# Patient Record
Sex: Female | Born: 1985 | Race: Black or African American | Hispanic: No | Marital: Single | Smoking: Never smoker
Health system: Southern US, Community
[De-identification: ages and names within clinical notes are randomized; demographics above are authoritative.]

## PROBLEM LIST (undated history)

## (undated) DIAGNOSIS — Q899 Congenital malformation, unspecified: Secondary | ICD-10-CM

## (undated) DIAGNOSIS — J45909 Unspecified asthma, uncomplicated: Secondary | ICD-10-CM

## (undated) DIAGNOSIS — M5430 Sciatica, unspecified side: Secondary | ICD-10-CM

## (undated) DIAGNOSIS — T8859XA Other complications of anesthesia, initial encounter: Secondary | ICD-10-CM

## (undated) HISTORY — DX: Other complications of anesthesia, initial encounter: T88.59XA

## (undated) HISTORY — DX: Unspecified asthma, uncomplicated: J45.909

## (undated) HISTORY — DX: Congenital malformation, unspecified: Q89.9

## (undated) HISTORY — DX: Sciatica, unspecified side: M54.30

---

## 2015-11-20 DIAGNOSIS — Q740 Other congenital malformations of upper limb(s), including shoulder girdle: Secondary | ICD-10-CM | POA: Insufficient documentation

## 2017-07-23 ENCOUNTER — Other Ambulatory Visit: Payer: Self-pay

## 2017-07-23 ENCOUNTER — Emergency Department: Payer: Medicare Other

## 2017-07-23 ENCOUNTER — Encounter: Payer: Self-pay | Admitting: *Deleted

## 2017-07-23 ENCOUNTER — Emergency Department
Admission: EM | Admit: 2017-07-23 | Discharge: 2017-07-23 | Disposition: A | Discharge status: Home / Self Care | Payer: Medicare Other | Attending: Emergency Medicine | Admitting: Emergency Medicine

## 2017-07-23 DIAGNOSIS — R10813 Right lower quadrant abdominal tenderness: Secondary | ICD-10-CM | POA: Insufficient documentation

## 2017-07-23 DIAGNOSIS — M7918 Myalgia, other site: Secondary | ICD-10-CM | POA: Diagnosis present

## 2017-07-23 DIAGNOSIS — F17210 Nicotine dependence, cigarettes, uncomplicated: Secondary | ICD-10-CM | POA: Insufficient documentation

## 2017-07-23 DIAGNOSIS — J189 Pneumonia, unspecified organism: Secondary | ICD-10-CM

## 2017-07-23 DIAGNOSIS — J111 Influenza due to unidentified influenza virus with other respiratory manifestations: Secondary | ICD-10-CM

## 2017-07-23 DIAGNOSIS — R69 Illness, unspecified: Secondary | ICD-10-CM

## 2017-07-23 DIAGNOSIS — J181 Lobar pneumonia, unspecified organism: Secondary | ICD-10-CM | POA: Diagnosis not present

## 2017-07-23 LAB — URINALYSIS, COMPLETE (UACMP) WITH MICROSCOPIC
BACTERIA UA: NONE SEEN
BILIRUBIN URINE: NEGATIVE
Glucose, UA: NEGATIVE mg/dL
Ketones, ur: 5 mg/dL — AB
Leukocytes, UA: NEGATIVE
NITRITE: NEGATIVE
PH: 6 (ref 5.0–8.0)
Protein, ur: NEGATIVE mg/dL
SPECIFIC GRAVITY, URINE: 1.001 — AB (ref 1.005–1.030)
Squamous Epithelial / LPF: NONE SEEN
WBC, UA: NONE SEEN WBC/hpf (ref 0–5)

## 2017-07-23 LAB — POCT PREGNANCY, URINE: Preg Test, Ur: NEGATIVE

## 2017-07-23 LAB — INFLUENZA PANEL BY PCR (TYPE A & B)
INFLAPCR: NEGATIVE
Influenza B By PCR: NEGATIVE

## 2017-07-23 MED ORDER — KETOROLAC TROMETHAMINE 60 MG/2ML IM SOLN
15.0000 mg | Freq: Once | INTRAMUSCULAR | Status: AC
  Administered 2017-07-23: 15 mg via INTRAMUSCULAR
  Filled 2017-07-23: qty 2

## 2017-07-23 MED ORDER — ACETAMINOPHEN 500 MG PO TABS
1000.0000 mg | ORAL_TABLET | Freq: Once | ORAL | Status: AC
  Administered 2017-07-23: 1000 mg via ORAL
  Filled 2017-07-23: qty 2

## 2017-07-23 MED ORDER — LEVOFLOXACIN 500 MG PO TABS: 500.0000 mg | ORAL_TABLET | Freq: Every day | ORAL | 0 refills | Status: AC

## 2017-07-23 NOTE — ED Provider Notes (Signed)
[facility] Emergency Department Provider Note  ____________________________________________  Time seen: Approximately 8:58 PM  I have reviewed the triage vital signs and the nursing notes.   HISTORY  Chief Complaint Generalized Body Aches and Abdominal Pain    HPI Monique Roy is a 31 y.o. female With congenital malformation of bilateral upper extremities who complains of generalized body aches for the past 2 days along with subjective fevers and chills and occasional sweats. He also has a nonproductive cough. No chest pain shortness of breath belly pain. She's had a few episodes of vomiting. No diarrhea or constipation. Also complains of low back pain. None of the pains are radiating, no aggravating or alleviating factors, moderate intensity and aching.  No history of spinal surgery or procedures. She is given birth twice but has not had an epidural.     No past medical history on file. none  There are no active problems to display for this patient.  past surgical history: C-section     Prior to Admission medications   Medication Sig Start Date End Date Taking? Authorizing Provider  levofloxacin (LEVAQUIN) 500 MG tablet Take 1 tablet (500 mg total) by mouth daily for 10 days. 07/23/17 08/02/17  Sharman CheekStafford, Cainan Trull, MD  none   Allergies Patient has no allergy information on record.   No family history on file.  Social History Social History   Tobacco Use  . Smoking status: Current Some Day Smoker    Types: Cigarettes  . Smokeless tobacco: Never Used  Substance Use Topics  . Alcohol use: No    Frequency: Never  . Drug use: Not on file    Review of Systems  Constitutional:   positive subjective fevers and chills..  ENT:   Positive sore throat. No rhinorrhea. Cardiovascular:   No chest pain or syncope. Respiratory:   No dyspnea positive cough. Gastrointestinal:   Negative for abdominal pain, positive vomiting, no diarrhea   Musculoskeletal:  positive low back pain. All other systems reviewed and are negative except as documented above in ROS and HPI.  ____________________________________________   PHYSICAL EXAM:  VITAL SIGNS: ED Triage Vitals  Enc Vitals Group     BP 07/23/17 1653 (!) 77/44     Pulse Rate 07/23/17 1649 (!) 110     Resp 07/23/17 1649 18     Temp 07/23/17 1649 100 F (37.8 C)     Temp Source 07/23/17 1720 Oral     SpO2 07/23/17 1649 96 %     Weight 07/23/17 1650 101 lb (45.8 kg)     Height 07/23/17 1650 5\' 1"  (1.549 m)     Head Circumference --      Peak Flow --      Pain Score 07/23/17 1649 10     Pain Loc --      Pain Edu? --      Excl. in GC? --     Vital signs reviewed, nursing assessments reviewed.   Constitutional:   Alert and oriented. Well appearing and in no distress. Eyes:   No scleral icterus.  EOMI. No nystagmus. No conjunctival pallor. PERRL. ENT   Head:   Normocephalic and atraumatic.   Nose:   No congestion/rhinnorhea.    Mouth/Throat:   MMM, no pharyngeal erythema. No peritonsillar mass.    Neck:   No meningismus. Full ROM. Hematological/Lymphatic/Immunilogical:   No cervical lymphadenopathy. Cardiovascular:   tachycardia heart rate 102. Symmetric bilateral radial and DP pulses.  No murmurs.  Respiratory:  Normal respiratory effort without tachypnea/retractions. Breath sounds are clear and equal bilaterally. No wheezes/rales/rhonchi. Gastrointestinal:   Soft with mild right lower quadrant tenderness. Non distended. There is no CVA tenderness.  No rebound, rigidity, or guarding. Genitourinary:   deferred Musculoskeletal:   Normal range of motion in all extremities. No joint effusions.  No lower extremity tenderness.  No edema.there is midline spinal tenderness over the mid lumbar spine Neurologic:   Normal speech and language.  Motor grossly intact. No gross focal neurologic deficits are appreciated.  Skin:    Skin is warm, dry and intact. No  rash noted.  No petechiae, purpura, or bullae.  ____________________________________________    LABS (pertinent positives/negatives) (all labs ordered are listed, but only abnormal results are displayed) Labs Reviewed  URINALYSIS, COMPLETE (UACMP) WITH MICROSCOPIC - Abnormal; Notable for the following components:      Result Value   Color, Urine STRAW (*)    APPearance CLEAR (*)    Specific Gravity, Urine 1.001 (*)    Hgb urine dipstick SMALL (*)    Ketones, ur 5 (*)    All other components within normal limits  CULTURE, BLOOD (ROUTINE X 2)  CULTURE, BLOOD (ROUTINE X 2)  INFLUENZA PANEL BY PCR (TYPE A & B)  COMPREHENSIVE METABOLIC PANEL  LACTIC ACID, PLASMA  LACTIC ACID, PLASMA  CBC WITH DIFFERENTIAL/PLATELET  PROTIME-INR  POC URINE PREG, ED  POCT PREGNANCY, URINE   ____________________________________________   EKG  interpreted by me Sinus rhythm rate of 99, normal axis , slightly prolonged QTC of 510. Normal QRS ST segments and T waves.  ____________________________________________    RADIOLOGY  Ct Abdomen Pelvis Wo Contrast  Result Date: 07/23/2017 CLINICAL DATA:  Generalized body, lower abdominal pain EXAM: CT ABDOMEN AND PELVIS WITHOUT CONTRAST TECHNIQUE: Multidetector CT imaging of the abdomen and pelvis was performed following the standard protocol without IV contrast. COMPARISON:  None. FINDINGS: Lower chest: Airspace disease in the left lower lobe compatible with pneumonia. No effusions. Hepatobiliary: No focal hepatic abnormality. Gallbladder unremarkable. Pancreas: No focal abnormality or ductal dilatation. Spleen: No focal abnormality.  Normal size. Adrenals/Urinary Tract: No adrenal abnormality. No focal renal abnormality. No stones or hydronephrosis. Urinary bladder is unremarkable. Stomach/Bowel: Stomach, large and small bowel grossly unremarkable. Vascular/Lymphatic: Uterus and adnexa unremarkable.  No mass. Reproductive: Uterus and adnexa unremarkable.  No  mass. Other: No free fluid or free air. Musculoskeletal: No acute bony abnormality. IMPRESSION: Airspace consolidation in the left lower lobe compatible with pneumonia. No acute findings in the abdomen or pelvis on this unenhanced study. Electronically Signed   By: Charlett Nose M.D.   On: 07/23/2017 18:57   Dg Chest Portable 1 View  Result Date: 07/23/2017 CLINICAL DATA:  Productive cough and fever. EXAM: PORTABLE CHEST 1 VIEW COMPARISON:  None. FINDINGS: The cardiomediastinal silhouette is normal in size. Normal pulmonary vascularity. No focal consolidation, pleural effusion, or pneumothorax. Nonunion of the mid right clavicle, which may be related to prior trauma or congenital in etiology. IMPRESSION: No active disease. Electronically Signed   By: Obie Dredge M.D.   On: 07/23/2017 17:12    ____________________________________________   PROCEDURES Procedures  ____________________________________________   DIFFERENTIAL DIAGNOSIS  appendicitis, diverticulitis, cystitis, pneumonia, epidural abscess, discitis or osteomyelitis of the spine  CLINICAL IMPRESSION / ASSESSMENT AND PLAN / ED COURSE  Pertinent labs & imaging results that were available during my care of the patient were reviewed by me and considered in my medical decision making (see chart for details).  Clinical Course as of Jul 23 2105  Wed Jul 23, 2017  1751 Pt refuses iv sticks for IV access and blood draw. Refuses central line (which has been required in past for her for iv access). Ua and cxr neg. Will proceed with imaging to search for infectious cause. If positive, can reassess with pt the need for iv access. Plan CT a/p without con and MRI L spine if CT negative.  On my exam, WA, NAD, 102/73, 95% RA, hr 102.   Offered fem stick for labs, refused.   Pt not clearly septic or in shock. Will work up within the limitations of what she desires for her medical care and wishes to avoid invasive procedure.  [PS]   2055 Pt now reports she is not willing to stay for MRI, wants to be DC'd immediatley, feels better.  BP stable. Tolerating PO. Has MDM capacity. Will DC. Will tx for pna.   [PS]    Clinical Course User Index [PS] Sharman CheekStafford, Ardit Danh, MD     ____________________________________________   FINAL CLINICAL IMPRESSION(S) / ED DIAGNOSES    Final diagnoses:  Influenza-like illness  Community acquired pneumonia of left lower lobe of lung (HCC)      This SmartLink is deprecated. Use AVSMEDLIST instead to display the medication list for a patient.   Portions of this note were generated with dragon dictation software. Dictation errors may occur despite best attempts at proofreading.    Sharman CheekStafford, Kohana Amble, MD 07/23/17 2106

## 2017-07-23 NOTE — ED Notes (Signed)
Pt refuses central line and wants to try less invasive at this time, MD states he will try US IV

## 2017-07-23 NOTE — ED Notes (Signed)
MD will try us IV for access

## 2017-07-23 NOTE — ED Notes (Signed)
Pt unable to stick, states she only gets central lines. MD aware

## 2017-07-23 NOTE — ED Triage Notes (Signed)
Pt to ED reporting generalized body aches, back pain, lower abd pain,  productive cough with yellow sputum, and weakness.  Symptoms are reported to have been for the last week. Unknown if pt has had fevers at home. No diarrhea or vomiting reported.

## 2017-07-24 ENCOUNTER — Telehealth: Payer: Self-pay | Admitting: Emergency Medicine

## 2017-07-24 NOTE — Telephone Encounter (Signed)
[facility] called to clarify levaquin quantity.  Per dr Derrill Kaygoodman patient should take for 7 days.

## 2019-05-10 ENCOUNTER — Ambulatory Visit: Payer: Medicare Other

## 2019-05-10 ENCOUNTER — Other Ambulatory Visit: Payer: Self-pay

## 2019-05-10 ENCOUNTER — Ambulatory Visit (LOCAL_COMMUNITY_HEALTH_CENTER): Payer: Medicare Other | Admitting: Advanced Practice Midwife

## 2019-05-10 VITALS — BP 111/77 | Ht 60.0 in | Wt 153.8 lb

## 2019-05-10 DIAGNOSIS — Q899 Congenital malformation, unspecified: Secondary | ICD-10-CM | POA: Insufficient documentation

## 2019-05-10 DIAGNOSIS — Z3009 Encounter for other general counseling and advice on contraception: Secondary | ICD-10-CM

## 2019-05-10 DIAGNOSIS — Z30013 Encounter for initial prescription of injectable contraceptive: Secondary | ICD-10-CM | POA: Diagnosis not present

## 2019-05-10 LAB — PREGNANCY, URINE: Preg Test, Ur: NEGATIVE

## 2019-05-10 MED ORDER — MEDROXYPROGESTERONE ACETATE 150 MG/ML IM SUSP
150.0000 mg | Freq: Once | INTRAMUSCULAR | Status: AC
Start: 2019-05-10 — End: 2019-05-10
  Administered 2019-05-10: 17:00:00 150 mg via INTRAMUSCULAR

## 2019-05-10 NOTE — Progress Notes (Signed)
   [place] problem visit  [place] Department  Subjective:  Monique Roy is a 33 y.o. being seen today for DMPA.  Last DMPA given "3 months ago in [facility]".  Pt states LMP 03/2019 and SVD 12/04/2018 bottlefeeding.  Nonsmoker.  Last sex 2019. LMP 03/2018?  States moved here from [facility] 03/2019  Chief Complaint  Patient presents with  . Contraception    needs Depo    HPI 33 yo SBF here for DMPA.  Vague historian and states her OB/GYN in [facility] told her she needed DMPA today.  Pt states she just urinated And can't give any more for PT.  Her Ob/Gyn office is closed so unable to verify today.  Here with 3 children  Does the patient have a current or past history of drug use? No   No components found for: HCV]   Health Maintenance Due  Topic Date Due  . HIV Screening  07/13/2001  . PAP SMEAR-Modifier  07/14/2007  . INFLUENZA VACCINE  03/06/2019    ROS  The following portions of the patient's history were reviewed and updated as appropriate: allergies, current medications, past family history, past medical history, past social history, past surgical history and problem list. Problem list updated.   See flowsheet for other program required questions.  Objective:   Vitals:   05/10/19 1541  BP: 111/77  Weight: 153 lb 12.8 oz (69.8 kg)  Height: 5' (1.524 m)    Physical Exam  n/a  Assessment and Plan:  Monique Roy is a 33 y.o. female presenting to the [facility] Department for a Women's Health problem visit    1.  Family planning If PT neg today may have DMPA 150 mg IM x1  Please counsel on need for abstinance/backup condoms next 7 days Needs to return for physical/CBE/pap before next DMPA given ROI last DMPA and pap [place], [place]     Return for 11-13 wk DMPA.  No future appointments.  Herbie Saxon, CNM

## 2019-05-10 NOTE — Progress Notes (Signed)
Pt here for Depo, was getting Depo at [facility] in [place] but recently moved. Last Depo injection was 3 months ago per pt.Ronny Bacon, RN

## 2019-05-10 NOTE — Progress Notes (Signed)
PT negative. Patient signed ROI for last depo visit in [facility]. Patient given Depo per provider orders and given next Depo reminder card.Jenetta Downer, RN

## 2019-05-18 ENCOUNTER — Other Ambulatory Visit: Payer: Self-pay

## 2019-05-18 DIAGNOSIS — Z20822 Contact with and (suspected) exposure to covid-19: Secondary | ICD-10-CM

## 2019-05-20 LAB — NOVEL CORONAVIRUS, NAA: SARS-CoV-2, NAA: NOT DETECTED

## 2019-05-23 ENCOUNTER — Emergency Department: Payer: Medicare Other

## 2019-05-23 ENCOUNTER — Other Ambulatory Visit: Payer: Self-pay

## 2019-05-23 ENCOUNTER — Encounter: Payer: Self-pay | Admitting: Emergency Medicine

## 2019-05-23 ENCOUNTER — Emergency Department
Admission: EM | Admit: 2019-05-23 | Discharge: 2019-05-23 | Disposition: A | Discharge status: Home / Self Care | Payer: Medicare Other | Attending: Emergency Medicine | Admitting: Emergency Medicine

## 2019-05-23 DIAGNOSIS — R0602 Shortness of breath: Secondary | ICD-10-CM | POA: Insufficient documentation

## 2019-05-23 DIAGNOSIS — F1210 Cannabis abuse, uncomplicated: Secondary | ICD-10-CM | POA: Insufficient documentation

## 2019-05-23 DIAGNOSIS — R05 Cough: Secondary | ICD-10-CM | POA: Diagnosis present

## 2019-05-23 DIAGNOSIS — J40 Bronchitis, not specified as acute or chronic: Secondary | ICD-10-CM | POA: Diagnosis not present

## 2019-05-23 MED ORDER — ALBUTEROL SULFATE HFA 108 (90 BASE) MCG/ACT IN AERS
2.0000 | INHALATION_SPRAY | Freq: Once | RESPIRATORY_TRACT | Status: AC
  Administered 2019-05-23: 2 via RESPIRATORY_TRACT
  Filled 2019-05-23: qty 6.7

## 2019-05-23 MED ORDER — PREDNISONE 20 MG PO TABS
60.0000 mg | ORAL_TABLET | Freq: Once | ORAL | Status: AC
  Administered 2019-05-23: 60 mg via ORAL
  Filled 2019-05-23: qty 3

## 2019-05-23 MED ORDER — HYDROCOD POLST-CPM POLST ER 10-8 MG/5ML PO SUER: 5.0000 mL | Freq: Two times a day (BID) | ORAL | 0 refills | Status: DC

## 2019-05-23 MED ORDER — PREDNISONE 20 MG PO TABS: ORAL_TABLET | ORAL | 0 refills | Status: DC

## 2019-05-23 NOTE — ED Notes (Signed)
Pt refused COVID-19 swab after swab unwrapped and placed in nare.

## 2019-05-23 NOTE — Discharge Instructions (Addendum)
1.  Finish prednisone 60 mg daily x4 days.  Start your next dose tomorrow morning. 2.  You may use albuterol inhaler 2 puffs every 4 hours as needed for cough/wheezing/difficulty breathing. 3.  You may take Tussionex as needed for cough. 4.  Return to the ER for worsening symptoms, persistent vomiting, difficulty breathing or other concerns.

## 2019-05-23 NOTE — ED Triage Notes (Addendum)
Pt reports shortness of breath for one week; worsens at night when she lays down; pt took OTC Robitussin DM and theraflu with no relief; also using vaporub with no relief; reports productive cough of white and yellow; negative COVID on 05/18/19; pt talking in complete coherent sentences; audible wheezes in triage;

## 2019-05-23 NOTE — ED Provider Notes (Signed)
[facility] Emergency Department Provider Note   ____________________________________________   First MD Initiated Contact with Patient 05/23/19 0410     (approximate)  I have reviewed the triage vital signs and the nursing notes.   HISTORY  Chief Complaint Shortness of Breath    HPI Monique Roy is a 33 y.o. female who presents to the ED from home with a chief complaint of cough and shortness of breath.  Symptoms x1 week.  Has been taking over-the-counter cough medicine and TheraFlu without relief of symptoms.  Negative Covid test on 10/13.  No other household members are sick.  Denies fever, chest pain, abdominal pain, nausea, vomiting or diarrhea.       History reviewed. No pertinent past medical history.  Patient Active Problem List   Diagnosis Date Noted  . 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 CESAREAN SECTION      Prior to Admission medications   Medication Sig Start Date End Date Taking? Authorizing Provider  chlorpheniramine-HYDROcodone (TUSSIONEX PENNKINETIC ER) 10-8 MG/5ML SUER Take 5 mLs by mouth 2 (two) times daily. 05/23/19   Irean Hong, MD  predniSONE (DELTASONE) 20 MG tablet 3 tablets daily x 4 days 05/23/19   Irean Hong, MD    Allergies Patient has no known allergies.  History reviewed. No pertinent family history.  Social History Social History   Tobacco Use  . Smoking status: Never Smoker  . Smokeless tobacco: Never Used  Substance Use Topics  . Alcohol use: No    Frequency: Never  . Drug use: Yes    Types: Marijuana    Comment: last smoked end of September    Review of Systems  Constitutional: No fever/chills Eyes: No visual changes. ENT: No sore throat. Cardiovascular: Denies chest pain. Respiratory: Positive for cough and wheezing.  Denies shortness of breath. Gastrointestinal: No abdominal pain.  No nausea, no vomiting.  No diarrhea.  No  constipation. Genitourinary: Negative for dysuria. Musculoskeletal: Negative for back pain. Skin: Negative for rash. Neurological: Negative for headaches, focal weakness or numbness.   ____________________________________________   PHYSICAL EXAM:  VITAL SIGNS: ED Triage Vitals  Enc Vitals Group     BP 05/23/19 0359 (!) 152/104     Pulse Rate 05/23/19 0359 80     Resp 05/23/19 0359 (!) 23     Temp 05/23/19 0359 97.9 F (36.6 C)     Temp Source 05/23/19 0359 Oral     SpO2 05/23/19 0359 100 %     Weight 05/23/19 0355 153 lb (69.4 kg)     Height 05/23/19 0355 5' (1.524 m)     Head Circumference --      Peak Flow --      Pain Score 05/23/19 0355 0     Pain Loc --      Pain Edu? --      Excl. in GC? --     Constitutional: Alert and oriented. Well appearing and in no acute distress. Eyes: Conjunctivae are normal. PERRL. EOMI. Head: Atraumatic. Nose: No congestion/rhinnorhea. Mouth/Throat: Mucous membranes are moist.  Oropharynx non-erythematous. Neck: No stridor.   Cardiovascular: Normal rate, regular rhythm. Grossly normal heart sounds.  Good peripheral circulation. Respiratory: Normal respiratory effort.  No retractions. Lungs with slight wheezing. Gastrointestinal: Soft and nontender. No distention. No abdominal bruits. No CVA tenderness. Musculoskeletal: Congenital missing upper limbs.  No lower extremity tenderness nor edema.  No joint effusions. Neurologic:  Normal speech and language. No gross focal neurologic deficits are appreciated. No gait instability. Skin:  Skin is warm, dry and intact. No rash noted. Psychiatric: Mood and affect are normal. Speech and behavior are normal.  ____________________________________________   LABS (all labs ordered are listed, but only abnormal results are displayed)  Labs Reviewed  SARS CORONAVIRUS 2 (TAT 6-24 HRS)   ____________________________________________  EKG  None ____________________________________________   RADIOLOGY  ED MD interpretation: No pneumonia  Official radiology report(s): Dg Chest 1 View  Result Date: 05/23/2019 CLINICAL DATA:  Shortness of breath for the past week. EXAM: CHEST  1 VIEW COMPARISON:  07/23/2017 FINDINGS: Grossly unchanged cardiac silhouette and mediastinal contours. No focal airspace opacities. No pleural effusion or pneumothorax. No evidence of edema. No acute osseous abnormalities. IMPRESSION: No acute cardiopulmonary disease. Electronically Signed   By: Sandi Mariscal M.D.   On: 05/23/2019 05:23    ____________________________________________   PROCEDURES  Procedure(s) performed (including Critical Care):  Procedures   ____________________________________________   INITIAL IMPRESSION / ASSESSMENT AND PLAN / ED COURSE  As part of my medical decision making, I reviewed the following data within the [place] notes reviewed and incorporated, Radiograph reviewed and Notes from prior ED visits     Monique Roy was evaluated in Emergency Department on 05/23/2019 for the symptoms described in the history of present illness. She was evaluated in the context of the global COVID-19 pandemic, which necessitated consideration that the patient might be at risk for infection with the SARS-CoV-2 virus that causes COVID-19. Institutional protocols and algorithms that pertain to the evaluation of patients at risk for COVID-19 are in a state of rapid change based on information released by regulatory bodies including the CDC and federal and state organizations. These policies and algorithms were followed during the patient's care in the ED.    34 year old female who presents with a 1 week history of cough and shortness of breath. Differential includes, but is not limited to, viral syndrome, bronchitis including COPD exacerbation, pneumonia, reactive airway disease including asthma, CHF including exacerbation with or without pulmonary/interstitial edema,  pneumothorax, ACS, thoracic trauma, and pulmonary embolism.  Room air saturations 100%.  Will obtain chest x-ray, send out Covid swab.  Start Prednisone, Tussionex and albuterol inhaler.  Will add azithromycin if chest x-ray is suspicious for pneumonia.  Clinical Course as of May 22 534  Sun May 23, 2019  0532 Chest x-ray unremarkable.  Patient refused COVID-19 swab.  Will discharge home on prednisone burst, albuterol inhaler and Tussionex.  Return precautions given.  Patient verbalizes understanding and agrees with plan of care.   [JS]    Clinical Course User Index [JS] Paulette Blanch, MD     ____________________________________________   FINAL CLINICAL IMPRESSION(S) / ED DIAGNOSES  Final diagnoses:  Bronchitis     ED Discharge Orders         Ordered    predniSONE (DELTASONE) 20 MG tablet     05/23/19 0534    chlorpheniramine-HYDROcodone (TUSSIONEX PENNKINETIC ER) 10-8 MG/5ML SUER  2 times daily     05/23/19 0534           Note:  This document was prepared using Dragon voice recognition software and may include unintentional dictation errors.   Paulette Blanch, MD 05/23/19 [phone]

## 2019-06-09 ENCOUNTER — Ambulatory Visit
Admission: EM | Admit: 2019-06-09 | Discharge: 2019-06-09 | Disposition: A | Discharge status: Home / Self Care | Payer: Medicare Other | Attending: Family Medicine | Admitting: Family Medicine

## 2019-06-09 ENCOUNTER — Encounter: Payer: Self-pay | Admitting: Emergency Medicine

## 2019-06-09 ENCOUNTER — Other Ambulatory Visit: Payer: Self-pay

## 2019-06-09 DIAGNOSIS — J9801 Acute bronchospasm: Secondary | ICD-10-CM

## 2019-06-09 DIAGNOSIS — Z79899 Other long term (current) drug therapy: Secondary | ICD-10-CM | POA: Diagnosis not present

## 2019-06-09 DIAGNOSIS — Z7952 Long term (current) use of systemic steroids: Secondary | ICD-10-CM | POA: Diagnosis not present

## 2019-06-09 DIAGNOSIS — Z793 Long term (current) use of hormonal contraceptives: Secondary | ICD-10-CM | POA: Insufficient documentation

## 2019-06-09 DIAGNOSIS — Z20828 Contact with and (suspected) exposure to other viral communicable diseases: Secondary | ICD-10-CM | POA: Insufficient documentation

## 2019-06-09 DIAGNOSIS — R062 Wheezing: Secondary | ICD-10-CM | POA: Diagnosis present

## 2019-06-09 MED ORDER — PREDNISONE 20 MG PO TABS: ORAL_TABLET | ORAL | 0 refills | Status: DC

## 2019-06-09 MED ORDER — ALBUTEROL SULFATE HFA 108 (90 BASE) MCG/ACT IN AERS: 1.0000 | INHALATION_SPRAY | Freq: Four times a day (QID) | RESPIRATORY_TRACT | 0 refills | Status: DC | PRN

## 2019-06-09 NOTE — ED Triage Notes (Signed)
Patient c/o wheezing and coughing that started on Sunday. She states she was seen in the ER for bronchitis on 05/23/19. She was given Prednisone which she stated helped her but she was unable to pick up the Tussionex. She stated when she leaves here she is going to pick that up but is requesting another prescription for Prednisone. She is also using an inhaler and she says she has about 50 doses left.

## 2019-06-09 NOTE — ED Provider Notes (Addendum)
[facility]    CSN: [id number] Arrival date & time: 06/09/19  1521      History   Chief Complaint Chief Complaint  Patient presents with  . Wheezing  . Cough    HPI Monique Roy is a 33 y.o. female.   33 yo female with a c/o recurrence of her wheezing and mild cough for the past 3 days. Denies any fevers, chills, shortness of breath, chest pain. States was seen in ED on 05/23/19, chest x-ray negative but refused covid test.  States she improved with albuterol and 4 days of prednisone.    Wheezing Associated symptoms: cough   Cough Associated symptoms: wheezing     History reviewed. No pertinent past medical history.  Patient Active Problem List   Diagnosis Date Noted  . 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 CESAREAN SECTION      OB History   No obstetric history on file.      Home Medications    Prior to Admission medications   Medication Sig Start Date End Date Taking? Authorizing Provider  chlorpheniramine-HYDROcodone (TUSSIONEX PENNKINETIC ER) 10-8 MG/5ML SUER Take 5 mLs by mouth 2 (two) times daily. 05/23/19  Yes Paulette Blanch, MD  medroxyPROGESTERone (DEPO-PROVERA) 150 MG/ML injection Inject 150 mg into the muscle every 3 (three) months.   Yes [provider]  albuterol (VENTOLIN HFA) 108 (90 Base) MCG/ACT inhaler Inhale 1-2 puffs into the lungs every 6 (six) hours as needed for wheezing or shortness of breath. 06/09/19   Norval Gable, MD  predniSONE (DELTASONE) 20 MG tablet 3 tabs po qd x 2 days, then 2 tabs po qd x 3 days, then 1 tab po qd x 3 days, then half a tab po qd x 2 days 06/09/19   Norval Gable, MD    Family History History reviewed. No pertinent family history.  Social History Social History   Tobacco Use  . Smoking status: Never Smoker  . Smokeless tobacco: Never Used  Substance Use Topics  . Alcohol use: Yes    Frequency: Never  . Drug use: Yes   Types: Marijuana    Comment: last use 1 week ago     Allergies   Patient has no known allergies.   Review of Systems Review of Systems  Respiratory: Positive for cough and wheezing.      Physical Exam Triage Vital Signs ED Triage Vitals  Enc Vitals Group     BP 06/09/19 1544 104/81     Pulse Rate 06/09/19 1544 (!) 120     Resp 06/09/19 1544 (!) 24     Temp 06/09/19 1544 98.6 F (37 C)     Temp Source 06/09/19 1544 Oral     SpO2 06/09/19 1544 95 %     Weight 06/09/19 1540 154 lb (69.9 kg)     Height 06/09/19 1540 5' (1.524 m)     Head Circumference --      Peak Flow --      Pain Score 06/09/19 1540 0     Pain Loc --      Pain Edu? --      Excl. in [place]? --    No data found.  Updated Vital Signs BP 104/81 (BP Location: Right Arm)   Pulse (!) 120   Temp 98.6 F (37 C) (Oral)   Resp (!) 24   Ht 5' (1.524 m)   Wt 69.9 kg  SpO2 95%   BMI 30.08 kg/m   Visual Acuity Right Eye Distance:   Left Eye Distance:   Bilateral Distance:    Right Eye Near:   Left Eye Near:    Bilateral Near:     Physical Exam Vitals signs and nursing note reviewed.  Constitutional:      General: She is not in acute distress.    Appearance: She is not toxic-appearing or diaphoretic.  Cardiovascular:     Rate and Rhythm: Regular rhythm. Tachycardia present.     Pulses: Normal pulses.  Pulmonary:     Effort: Pulmonary effort is normal. No respiratory distress.     Breath sounds: No stridor. Wheezing (mild, diffuse, bilaterally) present. No rhonchi or rales.  Neurological:     General: No focal deficit present.     Mental Status: She is alert.      UC Treatments / Results  Labs (all labs ordered are listed, but only abnormal results are displayed) Labs Reviewed  NOVEL CORONAVIRUS, NAA (HOSP ORDER, SEND-OUT TO REF LAB; TAT 18-24 HRS)    EKG   Radiology No results found.  Procedures Procedures (including critical care time)  Medications Ordered in UC Medications -  No data to display  Initial Impression / Assessment and Plan / UC Course  I have reviewed the triage vital signs and the nursing notes.  Pertinent labs & imaging results that were available during my care of the patient were reviewed by me and considered in my medical decision making (see chart for details).      Final Clinical Impressions(s) / UC Diagnoses   Final diagnoses:  Wheezing  Bronchospasm     Discharge Instructions     Go to Emergency Department if symptoms worsen    ED Prescriptions    Medication Sig Dispense Auth. Provider   predniSONE (DELTASONE) 20 MG tablet 3 tabs po qd x 2 days, then 2 tabs po qd x 3 days, then 1 tab po qd x 3 days, then half a tab po qd x 2 days 16 tablet Kerstie Agent, MD   albuterol (VENTOLIN HFA) 108 (90 Base) MCG/ACT inhaler Inhale 1-2 puffs into the lungs every 6 (six) hours as needed for wheezing or shortness of breath. 8 g Payton Mccallum, MD      1.diagnosis reviewed with patient 2. rx as per orders above; reviewed possible side effects, interactions, risks and benefits  3. Recommend supportive treatment rest, fluids 4.  covid test done 5. Go to Emergency Department if symptoms worsen 6. Follow-up prn   PDMP not reviewed this encounter.   Payton Mccallum, MD 06/09/19 1728    Payton Mccallum, MD 06/09/19 1729    Payton Mccallum, MD 06/09/19 1730

## 2019-06-09 NOTE — Discharge Instructions (Signed)
Go to Emergency Department if symptoms worsen °

## 2019-06-10 LAB — NOVEL CORONAVIRUS, NAA (HOSP ORDER, SEND-OUT TO REF LAB; TAT 18-24 HRS): SARS-CoV-2, NAA: NOT DETECTED

## 2019-06-14 ENCOUNTER — Encounter: Payer: Self-pay | Admitting: Physician Assistant

## 2019-06-14 NOTE — Progress Notes (Signed)
Reviewed record from [facility] in [place], [place].  Per record, patient had PP exam on 01/14/2019.  Per note, pap is due fall of 2020.  Depo after delivery in hospital or at this PP visit, though not clear from note.

## 2019-06-21 ENCOUNTER — Other Ambulatory Visit: Payer: Self-pay

## 2019-06-21 ENCOUNTER — Ambulatory Visit
Admission: EM | Admit: 2019-06-21 | Discharge: 2019-06-21 | Disposition: A | Discharge status: Home / Self Care | Payer: Medicare Other | Attending: Family Medicine | Admitting: Family Medicine

## 2019-06-21 DIAGNOSIS — R05 Cough: Secondary | ICD-10-CM

## 2019-06-21 DIAGNOSIS — R058 Other specified cough: Secondary | ICD-10-CM

## 2019-06-21 DIAGNOSIS — R062 Wheezing: Secondary | ICD-10-CM

## 2019-06-21 MED ORDER — FLUTICASONE-SALMETEROL 100-50 MCG/DOSE IN AEPB: 1.0000 | INHALATION_SPRAY | Freq: Two times a day (BID) | RESPIRATORY_TRACT | 1 refills | Status: DC

## 2019-06-21 MED ORDER — AZITHROMYCIN 250 MG PO TABS: ORAL_TABLET | ORAL | 0 refills | Status: DC

## 2019-06-21 MED ORDER — PREDNISONE 10 MG PO TABS: ORAL_TABLET | ORAL | 0 refills | Status: DC

## 2019-06-21 NOTE — ED Triage Notes (Addendum)
Pt reports seen twice for same over past month with 2 COVID tests both negative. Pt reports while she is taking the prescribed Prednisone she feels fine but as soon as the medicine runs out, the sx return of cough wheezing and SOB. No PCP as yet as she is new to the area. No fever

## 2019-06-21 NOTE — ED Provider Notes (Addendum)
[facility] ____________________________________________  Time seen: Approximately 6:47 PM  I have reviewed the triage vital signs and the nursing notes.   HISTORY  Chief Complaint Cough and Shortness of Breath   HPI Monique Roy is a 33 y.o. female presenting for evaluation of wheezing.  Patient reports for the last 2 months she has had recurrent cough and wheezing.  States the cough only occurs when actively wheezing and she can hear.  States she has been seen twice for the same complaints and has been given prednisone.  Reports symptoms fully resolve while taking the prednisone but once stopping prednisone symptoms return.  Occasional nasal congestion.  Denies sore throat, fevers, hemoptysis, extremity swelling, immobilization has had 2 negative Covid tests during the same time.  With the last being 11/4.  Chest x-ray 05/23/2019 was negative.  Reports she did have recurrent bronchitis as a child but has never been labeled as asthmatic.  Reports children healthy and not recent sickness.  Has continued remain active.  States she does continue to use her albuterol inhaler intermittently which helps but symptoms shortly returned.  Reports symptoms of wheezing returned 3 days ago after completing the prednisone course.  Denies any accompanying chest pain or chest pain with deep breath.  States shortness of breath is more of a tightness and wheeze sensation.  Intermittent cough, occasional cough productive of mucus.  Denies other aggravating alleviating factors.  Has not been on antibiotic recently.  Denies pregnancy.  No changes in taste or smell.  States that she does not currently have a primary care.   History reviewed. No pertinent past medical history.  Patient Active Problem List   Diagnosis Date Noted  . 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 CESAREAN SECTION       No current facility-administered medications  for this encounter.   Current Outpatient Medications:  .  albuterol (VENTOLIN HFA) 108 (90 Base) MCG/ACT inhaler, Inhale 1-2 puffs into the lungs every 6 (six) hours as needed for wheezing or shortness of breath., Disp: 8 g, Rfl: 0 .  azithromycin (ZITHROMAX Z-PAK) 250 MG tablet, Take 2 tablets (500 mg) on  Day 1,  followed by 1 tablet (250 mg) once daily on Days 2 through 5., Disp: 6 each, Rfl: 0 .  Fluticasone-Salmeterol (ADVAIR DISKUS) 100-50 MCG/DOSE AEPB, Inhale 1 puff into the lungs 2 (two) times daily., Disp: 60 each, Rfl: 1 .  medroxyPROGESTERone (DEPO-PROVERA) 150 MG/ML injection, Inject 150 mg into the muscle every 3 (three) months., Disp: , Rfl:  .  predniSONE (DELTASONE) 10 MG tablet, Start 60 mg po day one, then 50 mg po day two, taper by 10 mg daily until complete., Disp: 21 tablet, Rfl: 0  Allergies Patient has no known allergies.  pertinent family history Denies others in household with similar.  Social History Social History   Tobacco Use  . Smoking status: Never Smoker  . Smokeless tobacco: Never Used  Substance Use Topics  . Alcohol use: Yes    Frequency: Never    Comment: social  . Drug use: Yes    Types: Marijuana    Comment: last use 1 week ago    Review of Systems Constitutional: No fever/chills ENT: No sore throat. Cardiovascular: Denies chest pain. Respiratory: As above. Gastrointestinal: No abdominal pain.  No nausea, no vomiting.  No diarrhea.   Genitourinary: Negative for dysuria. Musculoskeletal: Negative for back pain. Skin: Negative for rash.   ____________________________________________  PHYSICAL EXAM:  VITAL SIGNS: ED Triage Vitals  Enc Vitals Group     BP 06/21/19 1751 114/87     Pulse Rate 06/21/19 1751 (!) 109 Recheck 82     Resp 06/21/19 1751 19     Temp 06/21/19 1751 98.1 F (36.7 C)     Temp Source 06/21/19 1751 Oral     SpO2 06/21/19 1751 100 %     Weight 06/21/19 1754 154 lb (69.9 kg)     Height 06/21/19 1754 5'  (1.524 m)     Head Circumference --      Peak Flow --      Pain Score 06/21/19 1754 0     Pain Loc --      Pain Edu? --      Excl. in [place]? --     Constitutional: Alert and oriented. Well appearing and in no acute distress. Eyes: Conjunctivae are normal.  ENT      Head: Normocephalic and atraumatic. Cardiovascular: Normal rate, regular rhythm. Grossly normal heart sounds.  Good peripheral circulation. Respiratory: Normal respiratory effort without tachypnea nor retractions.  No rhonchi.  Speaks in complete sentences.  Mild scattered inspiratory and expiratory wheezes.  No cough noted in room. Musculoskeletal: Steady gait.  No lower extremity edema noted bilaterally. Neurologic:  Normal speech and language. Speech is normal. No gait instability.  Skin:  Skin is warm, dry and intact. No rash noted. Psychiatric: Mood and affect are normal. Speech and behavior are normal. Patient exhibits appropriate insight and judgment   ___________________________________________   LABS (all labs ordered are listed, but only abnormal results are displayed)  Labs Reviewed - No data to display   PROCEDURES Procedures   INITIAL IMPRESSION / ASSESSMENT AND PLAN / ED COURSE  Pertinent labs & imaging results that were available during my care of the patient were reviewed by me and considered in my medical decision making (see chart for details). Discussed patient and plan of care with Dr. Judd Gaudier, who agreed with plan.   Patient is well-appearing.  Denies recent fevers.  Wheezes noted on exam.  Suspect bronchitis, discussed concern for asthma.  Symptoms reported to fully resolve while taking prednisone.  Will start patient on daily Advair post current prednisone taper.  Will empirically treat with azithromycin.  Will defer x-ray at this time as patient recently had chest x-ray, and patient declines x-ray.  Continue albuterol inhaler as needed.  Discussed very strict follow-up and return parameters.  Strongly  encourage patient to establish local primary care for close follow-up, also follow-up with pulmonology as needed.Discussed indication, risks and benefits of medications with patient.    Discussed follow up and return parameters including no resolution or any worsening concerns. Patient verbalized understanding and agreed to plan.   ____________________________________________   FINAL CLINICAL IMPRESSION(S) / ED DIAGNOSES  Final diagnoses:  Wheezing  Cough present for greater than 3 weeks     ED Discharge Orders         Ordered    Fluticasone-Salmeterol (ADVAIR DISKUS) 100-50 MCG/DOSE AEPB  2 times daily     06/21/19 1851    predniSONE (DELTASONE) 10 MG tablet     06/21/19 1851    azithromycin (ZITHROMAX Z-PAK) 250 MG tablet     06/21/19 1851           Note: This dictation was prepared with Dragon dictation along with smaller phrase technology. Any transcriptional errors that result from this process are unintentional.  Marylene Land, NP 06/21/19 1941

## 2019-06-21 NOTE — Discharge Instructions (Signed)
Take medication as prescribed. Rest. Drink plenty of fluids.  Continue albuterol inhaler as needed.  Establish and follow-up with primary care as soon as possible.  Follow-up with pulmonology as needed.  Return to Urgent care as needed.  Proceed directly to the emergency room for increasing shortness of breath, chest pain or worsening concerns.

## 2019-07-26 ENCOUNTER — Ambulatory Visit (LOCAL_COMMUNITY_HEALTH_CENTER): Payer: Medicare Other

## 2019-07-26 ENCOUNTER — Other Ambulatory Visit: Payer: Self-pay

## 2019-07-26 VITALS — BP 120/80 | Ht 60.0 in | Wt 158.0 lb

## 2019-07-26 DIAGNOSIS — Z30013 Encounter for initial prescription of injectable contraceptive: Secondary | ICD-10-CM

## 2019-07-26 DIAGNOSIS — Z3009 Encounter for other general counseling and advice on contraception: Secondary | ICD-10-CM

## 2019-07-26 MED ORDER — MEDROXYPROGESTERONE ACETATE 150 MG/ML IM SUSP
150.0000 mg | Freq: Once | INTRAMUSCULAR | Status: AC
  Administered 2019-07-26: 150 mg via INTRAMUSCULAR

## 2019-07-26 NOTE — Progress Notes (Signed)
Presents for Depo, but no current Depo orders. 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 Consult with Antoine Primas PA with following verbal orders given: 1) Depo 150 mg x1 today and 2 ) PAP due and needs to schedule physical appt when next Depo due 10/11/2019. Client counseled regarding above. 01/2019 post-partum visit records have been scanned. Client requested Depo in left arm today as hips are sore. Administered without difficulty per above order.  Rich Number, RN

## 2019-08-24 ENCOUNTER — Other Ambulatory Visit: Payer: Self-pay

## 2019-08-24 ENCOUNTER — Ambulatory Visit
Admission: EM | Admit: 2019-08-24 | Discharge: 2019-08-24 | Disposition: A | Discharge status: Home / Self Care | Payer: Medicare Other | Attending: Family Medicine | Admitting: Family Medicine

## 2019-08-24 ENCOUNTER — Encounter: Payer: Self-pay | Admitting: Emergency Medicine

## 2019-08-24 DIAGNOSIS — M5442 Lumbago with sciatica, left side: Secondary | ICD-10-CM | POA: Diagnosis not present

## 2019-08-24 MED ORDER — PREDNISONE 10 MG (21) PO TBPK: ORAL_TABLET | ORAL | 0 refills | Status: DC

## 2019-08-24 NOTE — Discharge Instructions (Signed)
Medication as directed.  Try the exercises.  Take care  Dr. Adriana Simas

## 2019-08-24 NOTE — ED Triage Notes (Signed)
Patient has left side back pain that radiates into her left leg x 4 days. She denies injury.

## 2019-08-24 NOTE — ED Provider Notes (Signed)
[facility]    CSN: [id number] Arrival date & time: 08/24/19  1430  History   Chief Complaint Chief Complaint  Patient presents with  . Leg Pain  . Back Pain   HPI  34 year old female presents with back pain.  Patient reports a 4-day history of left-sided low back pain.  She reports severe low back pain with radiation down her left lower extremity.  Also reports numbness.  No reports of saddle anesthesia or incontinence.  She rates her pain as 10/10 in severity.  She is tried over-the-counter Goody powder as well as heat without resolution.  No known inciting factor.  No fall, trauma, injury.  Seems to be better when she is moving around.  Worse when she is sitting.  No other complaints.  History reviewed and updated as below.  PMH: Asthma, Congenital abnormality   Past Surgical History:  Procedure Laterality Date  . CESAREAN SECTION      OB History   No obstetric history on file.     Home Medications    Prior to Admission medications   Medication Sig Start Date End Date Taking? Authorizing Provider  albuterol (VENTOLIN HFA) 108 (90 Base) MCG/ACT inhaler Inhale 1-2 puffs into the lungs every 6 (six) hours as needed for wheezing or shortness of breath. 06/09/19  Yes Conty, Pamala Hurry, MD  Fluticasone-Salmeterol (ADVAIR DISKUS) 100-50 MCG/DOSE AEPB Inhale 1 puff into the lungs 2 (two) times daily. 06/21/19  Yes Renford Dills, NP  predniSONE (STERAPRED UNI-PAK 21 TAB) 10 MG (21) TBPK tablet 6 tablets on day 1; decrease by 1 tablet daily until gone. 08/24/19   Tommie Sams, DO   Social History Social History   Tobacco Use  . Smoking status: Never Smoker  . Smokeless tobacco: Never Used  Substance Use Topics  . Alcohol use: Yes    Comment: social  . Drug use: Not Currently    Types: Marijuana    Comment: last use 1 week ago    Allergies   Patient has no known allergies.   Review of Systems Review of Systems  Constitutional: Negative.     Musculoskeletal: Positive for back pain.   Physical Exam Triage Vital Signs ED Triage Vitals [08/24/19 1453]  Enc Vitals Group     BP (!) 127/94     Pulse Rate 73     Resp 18     Temp 98.3 F (36.8 C)     Temp Source Oral     SpO2 100 %     Weight 159 lb (72.1 kg)     Height 5' (1.524 m)     Head Circumference      Peak Flow      Pain Score 10     Pain Loc      Pain Edu?      Excl. in [place]?    Updated Vital Signs BP (!) 127/94 (BP Location: Left Arm)   Pulse 73   Temp 98.3 F (36.8 C) (Oral)   Resp 18   Ht 5' (1.524 m)   Wt 72.1 kg   SpO2 100%   BMI 31.05 kg/m   Visual Acuity Right Eye Distance:   Left Eye Distance:   Bilateral Distance:    Right Eye Near:   Left Eye Near:    Bilateral Near:     Physical Exam Vitals and nursing note reviewed.  Constitutional:      General: She is not in acute distress.    Appearance: Normal appearance.  She is not ill-appearing.  HENT:     Head: Normocephalic and atraumatic.  Eyes:     General:        Right eye: No discharge.        Left eye: No discharge.     Conjunctiva/sclera: Conjunctivae normal.  Cardiovascular:     Rate and Rhythm: Normal rate and regular rhythm.     Heart sounds: No murmur.  Pulmonary:     Effort: Pulmonary effort is normal.     Breath sounds: Normal breath sounds. No wheezing, rhonchi or rales.  Musculoskeletal:     Comments: Lumbar spine -no appreciable spasm.  No tenderness to palpation.  Neurological:     Mental Status: She is alert.  Psychiatric:        Mood and Affect: Mood normal.        Behavior: Behavior normal.     UC Treatments / Results  Labs (all labs ordered are listed, but only abnormal results are displayed) Labs Reviewed - No data to display  EKG   Radiology No results found.  Procedures Procedures (including critical care time)  Medications Ordered in UC Medications - No data to display  Initial Impression / Assessment and Plan / UC Course  I have  reviewed the triage vital signs and the nursing notes.  Pertinent labs & imaging results that were available during my care of the patient were reviewed by me and considered in my medical decision making (see chart for details).    34 year old female presents with acute low back pain with sciatica.  New acute problem. Uncomplicated. Prednisone as prescribed.  Supportive care.  Final Clinical Impressions(s) / UC Diagnoses   Final diagnoses:  Acute left-sided low back pain with left-sided sciatica     Discharge Instructions     Medication as directed.  Try the exercises.  Take care  Dr. Lacinda Axon     ED Prescriptions    Medication Sig Dispense Auth. Provider   predniSONE (STERAPRED UNI-PAK 21 TAB) 10 MG (21) TBPK tablet 6 tablets on day 1; decrease by 1 tablet daily until gone. 21 tablet Thersa Salt G, DO     PDMP not reviewed this encounter.   Coral Spikes, [place] 08/24/19 1524

## 2019-09-21 ENCOUNTER — Telehealth: Payer: Self-pay | Admitting: Family Medicine

## 2019-09-21 NOTE — Telephone Encounter (Signed)
TC with patient. States would like to switch from Depo to OCPs, also needs pap. FP appt scheduled for 10/04/19 Richmond Campbell, RN

## 2019-09-21 NOTE — Telephone Encounter (Signed)
Patient has questions about getting off depo and transitioning to [facility] pill.

## 2019-10-04 ENCOUNTER — Ambulatory Visit: Payer: Medicare Other | Admitting: Family Medicine

## 2019-10-04 ENCOUNTER — Other Ambulatory Visit: Payer: Self-pay | Admitting: Family Medicine

## 2019-10-04 ENCOUNTER — Other Ambulatory Visit: Payer: Self-pay

## 2019-10-04 ENCOUNTER — Encounter: Payer: Self-pay | Admitting: Family Medicine

## 2019-10-04 VITALS — BP 119/88 | Ht 60.0 in | Wt 168.0 lb

## 2019-10-04 DIAGNOSIS — Z3009 Encounter for other general counseling and advice on contraception: Secondary | ICD-10-CM

## 2019-10-04 MED ORDER — NORGESTIM-ETH ESTRAD TRIPHASIC 0.18/0.215/0.25 MG-25 MCG PO TABS: 1.0000 | ORAL_TABLET | Freq: Every day | ORAL | 3 refills | Status: DC

## 2019-10-04 NOTE — Progress Notes (Signed)
Family Planning Visit  Subjective:  Verneal Roy is a 34 y.o. being seen today for  Chief Complaint  Patient presents with  . 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  HPI  Patient reports she would like to switch from depo to [facility], reports her bones have been hurting and she has gained wt.   Pt does not meet any of the following contraindications to estrogen use: -Age ?35 years and smoking ?15 cigarettes per day -Migraine with aura -Two or more RF for arterial CVD (such as older age, smoking, diabetes, and hypertension) -HTN -Breast cancer -VTE hx or acute event -Known thrombogenic mutations -Known ischemic heart disease -History of stroke -Complicated valvular heart disease (pulmonary HTN, risk for afib, hx subacute bacterial endocarditis) -Cirrhosis, Hepatocellular adenoma or malignant hepatoma    No LMP recorded. Patient has had an injection. BCM: last depo 12/21, not overdue Pt desires EC? n/a  Last pap: she believes 2017 at [facility], not sure  Patient reports 1 partner(s) in last year. Do they desire STI screening (if no, why not)? no  Does the patient desire a pregnancy in the next year? no   34 y.o., Body mass index is 32.81 kg/m. - Is patient eligible for HA1C diabetes screening based on BMI and age >69?  no  Does the patient have a current or past history of drug use? no No components found for: HCV  See flowsheet for other program required questions.   Health Maintenance Due  Topic Date Due  . HIV Screening  07/13/2001  . PAP SMEAR-Modifier  07/14/2007  . INFLUENZA VACCINE  03/06/2019    ROS  The following portions of the patient's history were reviewed and updated as appropriate: allergies, current medications, past family history, past medical history, past social history, past surgical history and problem list. Problem list updated.  Objective:  BP 119/88   Ht 5' (1.524 m)   Wt 168 lb (76.2 kg)   BMI 32.81  kg/m   Physical Exam  Gen: well appearing, NAD HEENT: no scleral icterus Lung: Normal WOB Ext: well perfused, no edema    Assessment and Plan:  Monique Roy is a 34 y.o. female presenting to the [facility] Department for a well woman exam/family planning visit  Contraception counseling: Reviewed all forms of birth control options in the tiered based approach including abstinence; over the counter/barrier methods; hormonal contraceptive medication including pill, patch, ring, injection, contraceptive implant; hormonal and nonhormonal IUDs; permanent sterilization options including vasectomy and the various tubal sterilization modalities. Risks, benefits, how to discontinue and typical effectiveness rates were reviewed.  Questions were answered.  Written information was also given to the patient to review.  Patient desires OCP, this was prescribed for patient. She will follow up in  1 year for surveillance.  She was told to call with any further questions, or with any concerns about this method of contraception.  Emphasized use of condoms 100% of the time for STI prevention.  Emergency Contraception: n/a - continuous use of depo   1. Family planning services -Rx OCP x1 yr. Counseling as above.  -Offered pap today as her pap hx is unknown and she is likely due. She declines today but states she will get at next visit.  - Norgestimate-Ethinyl Estradiol Triphasic (ORTHO TRI-CYCLEN LO) 0.18/0.215/0.25 MG-25 MCG tab; Take 1 tablet by mouth daily.  Dispense: 3 Package; Refill: 3     Return in about 1 year (around  10/03/2020) for yearly wellness exam.  No future appointments.  Ann Held, PA-C

## 2019-10-04 NOTE — Progress Notes (Signed)
In desiring change to O.C.'s from Depo-last rec'd 07/25/20; states she is due a Pap-does not know name of practice in [place] where last pap done; Sharlette Dense, RN

## 2019-12-01 ENCOUNTER — Other Ambulatory Visit: Payer: Self-pay | Admitting: Family Medicine

## 2019-12-01 NOTE — Telephone Encounter (Signed)
Patient needs refill on her birth control perscription.

## 2019-12-02 NOTE — Telephone Encounter (Signed)
Call to patient at number [phone].  Patient counseled that per chart review, she should have 3 refills on her Rx for her OCs available at her pharmacy.  Counseled patient to contact her pharmacy and request a refill and if the pharmacy does not have refills on file to have the pharmacy to contact us so that we can approve refills for her.  Counseled that when she picks up her last refill to call clinic and make appt for RP/pap and more OC refills.  Patient verbalizes understanding and agrees with above plan.

## 2020-08-14 IMAGING — DX DG CHEST 1V
1 series · 1 of 1 positions shown · non-contrast
Comparison: 07/23/2017

CLINICAL DATA: Shortness of breath for the past week.

EXAM:
CHEST  1 VIEW

[chest ap]
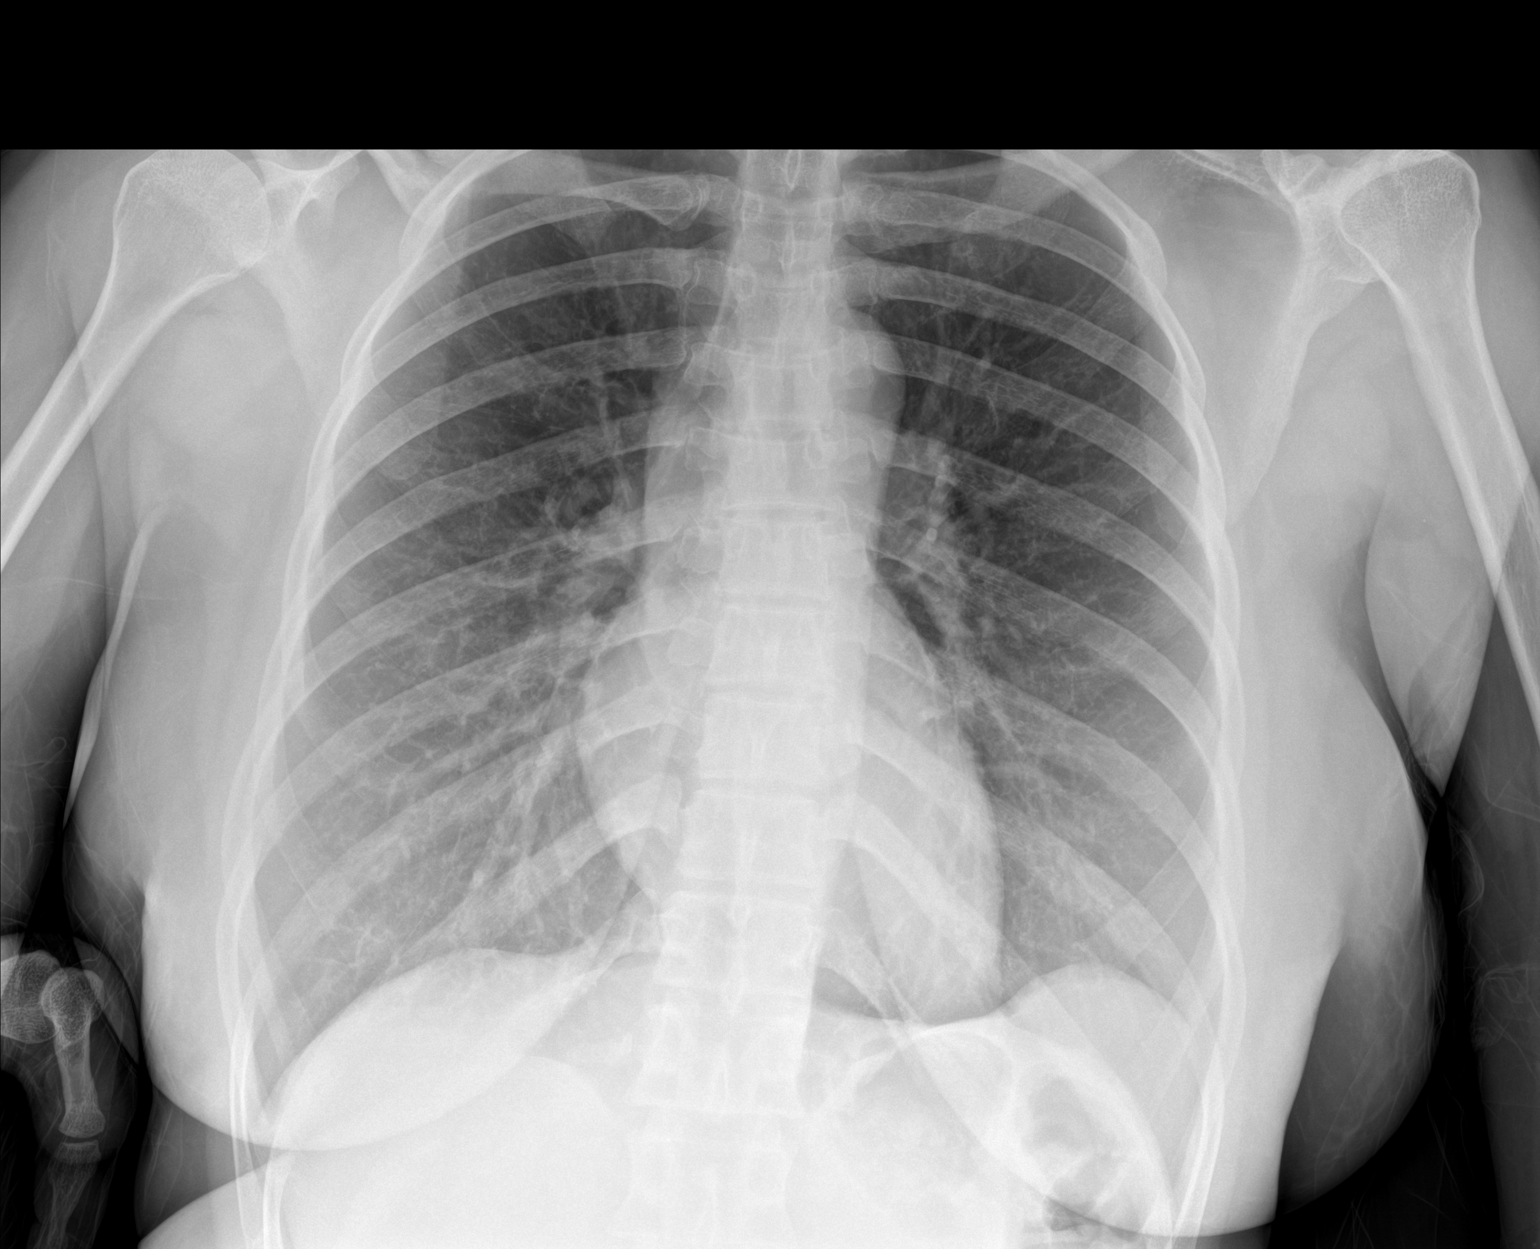

[1 of 1 positions shown; findings below may reference images not displayed]

FINDINGS: Grossly unchanged cardiac silhouette and mediastinal contours. No
focal airspace opacities. No pleural effusion or pneumothorax. No
evidence of edema. No acute osseous abnormalities.
IMPRESSION: No acute cardiopulmonary disease.

## 2021-01-23 ENCOUNTER — Other Ambulatory Visit: Payer: Self-pay

## 2021-01-23 ENCOUNTER — Ambulatory Visit (LOCAL_COMMUNITY_HEALTH_CENTER): Payer: Medicare Other

## 2021-01-23 VITALS — BP 119/83 | Ht 61.0 in | Wt 156.0 lb

## 2021-01-23 DIAGNOSIS — Z3201 Encounter for pregnancy test, result positive: Secondary | ICD-10-CM | POA: Diagnosis not present

## 2021-01-23 LAB — PREGNANCY, URINE: Preg Test, Ur: POSITIVE — AB

## 2021-01-23 MED ORDER — PRENATAL 27-0.8 MG PO TABS: 1.0000 | ORAL_TABLET | Freq: Every day | ORAL | 0 refills | Status: AC

## 2021-01-23 NOTE — Progress Notes (Signed)
UPT positive. Plans prenatal care at [facility]. Takes flovent inhaler daily for asthma. Consult Ralene Bathe, MD who explains that asthma may worsen during pregnancy and flovent is considered safe during preg and impt for pt to contact prescribing provider for any adjustments. Pt in agreement. To clerk for preadmit. Jerel Shepherd, RN

## 2021-01-30 NOTE — Progress Notes (Signed)
Attestation: I agree with the advice given to this patient by our nurse staff.  I have reviewed the RN's note and chart. Documentation reflects my recommendations which were given verbally to the nurse at the point of care.   Kaveh Kissinger Niles Sylvia Kondracki, MD, MPH, ABFM Medical Director  [facility]  

## 2021-02-18 ENCOUNTER — Emergency Department
Admission: EM | Admit: 2021-02-18 | Discharge: 2021-02-19 | Disposition: A | Discharge status: Home / Self Care | Payer: Medicare Other | Attending: Emergency Medicine | Admitting: Emergency Medicine

## 2021-02-18 ENCOUNTER — Other Ambulatory Visit: Payer: Self-pay

## 2021-02-18 ENCOUNTER — Emergency Department: Payer: Medicare Other

## 2021-02-18 DIAGNOSIS — O4691 Antepartum hemorrhage, unspecified, first trimester: Secondary | ICD-10-CM | POA: Insufficient documentation

## 2021-02-18 DIAGNOSIS — O200 Threatened abortion: Secondary | ICD-10-CM | POA: Insufficient documentation

## 2021-02-18 DIAGNOSIS — O208 Other hemorrhage in early pregnancy: Secondary | ICD-10-CM | POA: Diagnosis present

## 2021-02-18 DIAGNOSIS — J45909 Unspecified asthma, uncomplicated: Secondary | ICD-10-CM | POA: Diagnosis not present

## 2021-02-18 DIAGNOSIS — O468X1 Other antepartum hemorrhage, first trimester: Secondary | ICD-10-CM

## 2021-02-18 DIAGNOSIS — O99511 Diseases of the respiratory system complicating pregnancy, first trimester: Secondary | ICD-10-CM | POA: Diagnosis not present

## 2021-02-18 DIAGNOSIS — Z7951 Long term (current) use of inhaled steroids: Secondary | ICD-10-CM | POA: Insufficient documentation

## 2021-02-18 DIAGNOSIS — O418X10 Other specified disorders of amniotic fluid and membranes, first trimester, not applicable or unspecified: Secondary | ICD-10-CM

## 2021-02-18 DIAGNOSIS — O4690 Antepartum hemorrhage, unspecified, unspecified trimester: Secondary | ICD-10-CM

## 2021-02-18 DIAGNOSIS — Z3A10 10 weeks gestation of pregnancy: Secondary | ICD-10-CM | POA: Diagnosis not present

## 2021-02-18 LAB — CBC
HCT: 41.9 % (ref 36.0–46.0)
Hemoglobin: 14 g/dL (ref 12.0–15.0)
MCH: 29.6 pg (ref 26.0–34.0)
MCHC: 33.4 g/dL (ref 30.0–36.0)
MCV: 88.6 fL (ref 80.0–100.0)
Platelets: 318 10*3/uL (ref 150–400)
RBC: 4.73 MIL/uL (ref 3.87–5.11)
RDW: 13.1 % (ref 11.5–15.5)
WBC: 7.8 10*3/uL (ref 4.0–10.5)
nRBC: 0 % (ref 0.0–0.2)

## 2021-02-18 LAB — COMPREHENSIVE METABOLIC PANEL
ALT: 16 U/L (ref 0–44)
AST: 22 U/L (ref 15–41)
Albumin: 4.3 g/dL (ref 3.5–5.0)
Alkaline Phosphatase: 67 U/L (ref 38–126)
Anion gap: 7 (ref 5–15)
BUN: 9 mg/dL (ref 6–20)
CO2: 23 mmol/L (ref 22–32)
Calcium: 9.5 mg/dL (ref 8.9–10.3)
Chloride: 105 mmol/L (ref 98–111)
Creatinine, Ser: 0.74 mg/dL (ref 0.44–1.00)
GFR, Estimated: >60 mL/min (ref >60)
Glucose, Bld: 87 mg/dL (ref 70–99)
Potassium: 3.7 mmol/L (ref 3.5–5.1)
Sodium: 135 mmol/L (ref 135–145)
Total Bilirubin: 0.6 mg/dL (ref 0.3–1.2)
Total Protein: 7.8 g/dL (ref 6.5–8.1)

## 2021-02-18 LAB — URINALYSIS, COMPLETE (UACMP) WITH MICROSCOPIC
Bilirubin Urine: NEGATIVE
Glucose, UA: NEGATIVE mg/dL
Ketones, ur: NEGATIVE mg/dL
Leukocytes,Ua: NEGATIVE
Nitrite: NEGATIVE
Protein, ur: NEGATIVE mg/dL
RBC / HPF: >50 RBC/hpf — ABNORMAL HIGH (ref 0–5)
Specific Gravity, Urine: 1.012 (ref 1.005–1.030)
WBC, UA: NONE SEEN WBC/hpf (ref 0–5)
pH: 6 (ref 5.0–8.0)

## 2021-02-18 LAB — ABO/RH: ABO/RH(D): B POS

## 2021-02-18 LAB — HCG, QUANTITATIVE, PREGNANCY: hCG, Beta Chain, Quant, S: 4366 m[IU]/mL — ABNORMAL HIGH (ref <5)

## 2021-02-18 NOTE — ED Triage Notes (Signed)
Pt is approx [redacted] weeks pregnant and noted vaginal bleeding today. G4P3, no co pain.

## 2021-02-19 ENCOUNTER — Telehealth: Payer: Self-pay | Admitting: Family Medicine

## 2021-02-19 NOTE — Discharge Instructions (Addendum)
As we discussed, your ultrasound shows a gestational sac at 6 weeks and 1 day, but there is no visible heartbeat at this time.  The radiologist thinks that it is likely that this is a failed pregnancy that is not going to develop into a baby and will result in a miscarriage.  As we discussed, you should follow-up at the health department within the next 10 to 14 days for repeat ultrasound and likely for repeat beta-hCG, the blood pregnancy test.  Your value tonight was only about 4300 which is a bit low for a gestational age of [redacted] weeks.  If the pregnancy is not developing it will likely be going down when you follow-up.    Return to the emergency department if you develop new or worsening symptoms that concern you.

## 2021-02-19 NOTE — Telephone Encounter (Signed)
Patient called and states that she is scheduled for her first OB appointment on 03/01/21.  She has started spotting and went to the hospital and they directed her to call us and see if she needs to be seen earlier than her appointment.

## 2021-02-19 NOTE — Telephone Encounter (Signed)
Consulted with Arnetha Courser, CNM.  Phone call to pt. Pt states she had extremely heavy bleeding yesterday and proceeded to hospital. Pt states she is having bleeding right now that looks like a period but having some clots and some clear discharge with it sometimes. Changed pad about 2 times today since coming home from hospital, taking OTC pain reliever for cramps and it is helping. Pt counseled that she should return to hospital if she starts having the extremely heavy bleeding again that she experienced yesterday right before going to hospital; pt expressed understanding. Per provider order, pt scheduled for lab only appt for 02/20/21; Arnetha Courser, CNM gave verbal order to have Beta HCG drawn.

## 2021-02-19 NOTE — ED Provider Notes (Signed)
[facility] Emergency Department Provider Note  ____________________________________________   Event Date/Time   First MD Initiated Contact with Patient 02/19/21 0007     (approximate)  I have reviewed the triage vital signs and the nursing notes.   HISTORY  Chief Complaint Vaginal Bleeding    HPI Monique Roy is a 35 y.o. female G4, P3 at an estimated [redacted] weeks gestation based on last menstrual period.  She presents tonight for cute onset vaginal bleeding similar to a normal menstrual period.  She said that she feels a little bit of cramping but it feels mostly like gas.  No severe or sharp pain.  No passage of products or clots.  She had a little bit of blood when she went to the bathroom and then she took a shower and checked again and she had some more bleeding so she came in.  She has not had a miscarriage in the past.  Nothing in particular makes the symptoms better or worse and the bleeding is relatively mild at this time.  Pain is minimal.  No recent fevers, dysuria, vomiting, chest pain or shortness of breath.     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 Authorizing Provider  albuterol (VENTOLIN HFA) 108 (90 Base) MCG/ACT inhaler Inhale 1-2 puffs into the lungs every 6 (six) hours as needed for wheezing or shortness of breath. 06/09/19   Payton Mccallum, MD  fluticasone (FLOVENT HFA) 110 MCG/ACT inhaler Inhale into the lungs. 06/23/19 06/22/20  [provider]  Fluticasone-Salmeterol (ADVAIR DISKUS) 100-50 MCG/DOSE AEPB Inhale 1 puff into the lungs 2 (two) times  daily. 06/21/19   Renford Dills, NP  Norgestimate-Ethinyl Estradiol Triphasic (ORTHO TRI-CYCLEN LO) 0.18/0.215/0.25 MG-25 MCG tab Take 1 tablet by mouth daily. Patient not taking: Reported on 01/23/2021 10/04/19   Federico Flake, MD  predniSONE (STERAPRED UNI-PAK 21 TAB) 10 MG (21) TBPK tablet 6 tablets on day 1; decrease by 1 tablet daily until gone. Patient not taking: Reported on 01/23/2021 08/24/19   Tommie Sams, DO  Prenatal Vit-Fe Fumarate-FA (MULTIVITAMIN-PRENATAL) 27-0.8 MG TABS tablet Take 1 tablet by mouth daily at 12 noon. 01/23/21 05/03/21  Federico Flake, MD    Allergies Patient has no known allergies.  No family history on file.  Social History Social History   Tobacco Use   Smoking status: Never   Smokeless tobacco: Never  Vaping Use   Vaping Use: Never used  Substance Use Topics   Alcohol use: Yes    Comment: 1x/mo, last use 12/2020   Drug use: Yes    Types: Marijuana    Comment: last use- 01/16/21    Review of Systems Constitutional: No fever/chills Eyes: No visual changes. ENT: No sore throat. Cardiovascular: Denies chest pain. Respiratory: Denies shortness of breath. Gastrointestinal: No abdominal pain.  No nausea, no vomiting.  No diarrhea.  No constipation. Genitourinary: Positive for vaginal bleeding during early pregnancy.  Negative for dysuria. Musculoskeletal: Negative for neck pain.  Negative for back pain. Integumentary:  Negative for rash. Neurological: Negative for headaches, focal weakness or numbness.   ____________________________________________   PHYSICAL EXAM:  VITAL SIGNS: ED Triage Vitals  Enc Vitals Group     BP 02/18/21 2158 (!) 141/89     Pulse Rate 02/18/21 2158 71     Resp 02/18/21 2158 20     Temp 02/18/21 2158 98.4 F (36.9 C)     Temp Source 02/18/21 2158 Oral     SpO2 02/18/21 2158 98 %     Weight 02/18/21 2159 72.6 kg (160 lb)     Height 02/18/21 2159 1.524 m (5')     Head Circumference --      Peak  Flow --      Pain Score 02/18/21 2158 0     Pain Loc --      Pain Edu? --      Excl. in GC? --     Constitutional: Alert and oriented.  Eyes: Conjunctivae are normal.  Head: Atraumatic. Nose: No congestion/rhinnorhea. Mouth/Throat: Patient is wearing a mask. Neck: No stridor.  No meningeal signs.   Cardiovascular: Normal rate, regular rhythm. Good peripheral circulation. Respiratory: Normal respiratory effort.  No retractions. Gastrointestinal: Soft and nontender. No distention.  Genitourinary: Deferred based on joint decision-making with the patient. Musculoskeletal: No lower extremity tenderness nor edema. No gross deformities of extremities. Neurologic:  Normal speech and language. No gross focal neurologic deficits are appreciated.  Skin:  Skin is warm, dry and intact. Psychiatric: Mood and affect are normal. Speech and behavior are normal.  ____________________________________________   LABS (all labs ordered are listed, but only abnormal results are displayed)  Labs Reviewed  HCG, QUANTITATIVE, PREGNANCY - Abnormal; Notable for the following components:      Result Value   hCG, Beta Chain, Quant, S 4,366 (*)    All other components within normal limits  URINALYSIS, COMPLETE (UACMP) WITH MICROSCOPIC - Abnormal; Notable for the following components:   Color, Urine YELLOW (*)    APPearance HAZY (*)    Hgb urine dipstick LARGE (*)    RBC / HPF >50 (*)    Bacteria, UA RARE (*)    All other components within normal limits  CBC  COMPREHENSIVE METABOLIC PANEL  ABO/RH   ____________________________________________   RADIOLOGY Marylou Mccoy, personally viewed and evaluated these images (plain radiographs) as part of my medical decision making, as well as reviewing the written report by the radiologist.  ED MD interpretation: Trace subchorionic hemorrhage, suspicious for failed pregnancy given intrauterine gestational sac at 6 weeks and 1 day but with no documented  cardiac activity  Official radiology report(s): US OB LESS THAN 14 WEEKS WITH OB TRANSVAGINAL  Result Date: 02/18/2021 CLINICAL DATA:  Vaginal bleeding for 1 day, quantitative beta HCG 4,366 EXAM: OBSTETRIC <14 WK [place] AND TRANSVAGINAL OB US TECHNIQUE: Both transabdominal and transvaginal ultrasound examinations were performed for complete evaluation of the gestation as well as the maternal uterus, adnexal regions, and pelvic cul-de-sac. Transvaginal technique was performed to assess early pregnancy. COMPARISON:  None. FINDINGS: Intrauterine gestational sac: Single Yolk sac:  Visualized. Embryo:  Visualized. Cardiac Activity: Not Visualized. CRL: 4.7 mm   6 w   1 d                  [place] EDC: 10/13/2021 Subchorionic hemorrhage: Trace subchorionic hemorrhage along the inferior margin of the gestational sac. Maternal uterus/adnexae: Uterus is otherwise unremarkable. Right ovary measures 2.3 x 1.3 x 1.8 cm and the left ovary  measures 2.4 x 1.0 x 1.8 cm. No adnexal masses or free fluid. IMPRESSION: 1. Single intrauterine pregnancy estimated age 70 weeks and 1 day, with no documented cardiac activity. Findings are suspicious but not yet definitive for failed pregnancy. Recommend follow-up US in 10-14 days for definitive diagnosis. This recommendation follows SRU consensus guidelines: Diagnostic Criteria for Nonviable Pregnancy Early in the First Trimester. Malva Limes Med 2013; 295:6213-08. 2. Trace subchorionic hemorrhage along the inferior margin of the gestational sac. Electronically Signed   By: Sharlet Salina M.D.   On: 02/18/2021 23:45    ____________________________________________   PROCEDURES   Procedure(s) performed (including Critical Care):  Procedures   ____________________________________________   INITIAL IMPRESSION / MDM / ASSESSMENT AND PLAN / ED COURSE  As part of my medical decision making, I reviewed the following data within the electronic MEDICAL RECORD NUMBER Nursing notes reviewed and  incorporated, Labs reviewed , Old chart reviewed, and Notes from prior ED visits   Differential diagnosis includes, but is not limited to, threatened miscarriage, incomplete miscarriage, normal bleeding from an early trimester pregnancy, ectopic pregnancy, , blighted ovum, vaginal/cervical trauma, subchorionic hemorrhage/hematoma, etc.  Patient is B+ and does not require RhoGAM.  Comprehensive metabolic panel is normal, urinalysis is notable only for blood which is to be expected given her vaginal bleeding.  CBC is normal including no evidence of anemia.  Patient is having minimal discomfort and has no tenderness to palpation.  We talked about doing a an ultrasound but I explained that I do not think it would be particular helpful diagnostically or therapeutically and she said she would prefer to wait for a follow-up appointment at the health department.  Her beta-hCG is only 4366 which is less than anticipated for her ultrasound measured gestational age of [redacted] weeks and 1 day.  The ultrasound is strongly suggestive of a failed pregnancy.  The radiologist recommended follow-up in 10 to 14 days with another ultrasound.  I passed along all this information to the patient and she understands and accepts that this is most likely not going to develop into a pregnancy.  She is very comfortable following up at the health department and states she already has an appointment scheduled within the next 2 weeks.  I also told her about the hCG and recommended if possible that she had a repeat hCG performed.  She understands and agrees with the plan.  I gave my usual and customary return precautions.           ____________________________________________  FINAL CLINICAL IMPRESSION(S) / ED DIAGNOSES  Final diagnoses:  Threatened miscarriage in early pregnancy  Subchorionic hemorrhage of placenta in first trimester, single or unspecified fetus     MEDICATIONS GIVEN DURING THIS VISIT:  Medications - No  data to display   ED Discharge Orders     None        Note:  This document was prepared using Dragon voice recognition software and may include unintentional dictation errors.   Loleta Rose, MD 02/19/21 [phone]

## 2021-02-20 ENCOUNTER — Emergency Department
Admission: EM | Admit: 2021-02-20 | Discharge: 2021-02-20 | Disposition: A | Discharge status: Home / Self Care | Payer: Medicare Other | Attending: Emergency Medicine | Admitting: Emergency Medicine

## 2021-02-20 ENCOUNTER — Other Ambulatory Visit: Payer: Self-pay

## 2021-02-20 ENCOUNTER — Other Ambulatory Visit: Payer: Medicare Other

## 2021-02-20 DIAGNOSIS — O200 Threatened abortion: Secondary | ICD-10-CM

## 2021-02-20 DIAGNOSIS — Z3A00 Weeks of gestation of pregnancy not specified: Secondary | ICD-10-CM | POA: Diagnosis not present

## 2021-02-20 DIAGNOSIS — J45909 Unspecified asthma, uncomplicated: Secondary | ICD-10-CM | POA: Insufficient documentation

## 2021-02-20 DIAGNOSIS — O039 Complete or unspecified spontaneous abortion without complication: Secondary | ICD-10-CM | POA: Diagnosis not present

## 2021-02-20 DIAGNOSIS — N9489 Other specified conditions associated with female genital organs and menstrual cycle: Secondary | ICD-10-CM | POA: Insufficient documentation

## 2021-02-20 DIAGNOSIS — Z7951 Long term (current) use of inhaled steroids: Secondary | ICD-10-CM | POA: Diagnosis not present

## 2021-02-20 DIAGNOSIS — O99519 Diseases of the respiratory system complicating pregnancy, unspecified trimester: Secondary | ICD-10-CM | POA: Insufficient documentation

## 2021-02-20 DIAGNOSIS — O208 Other hemorrhage in early pregnancy: Secondary | ICD-10-CM | POA: Diagnosis present

## 2021-02-20 DIAGNOSIS — Z719 Counseling, unspecified: Secondary | ICD-10-CM

## 2021-02-20 LAB — HCG, QUANTITATIVE, PREGNANCY: hCG, Beta Chain, Quant, S: 455 m[IU]/mL — ABNORMAL HIGH (ref <5)

## 2021-02-20 NOTE — ED Triage Notes (Signed)
Pt comes with need hcg beta rechecked.

## 2021-02-20 NOTE — ED Provider Notes (Signed)
[facility] Emergency Department Provider Note  ____________________________________________   Event Date/Time   First MD Initiated Contact with Patient 02/20/21 1707     (approximate)  I have reviewed the triage vital signs and the nursing notes.   HISTORY  Chief Complaint abnormal labs  HPI Monique Roy is a 35 y.o. female who reports to the emergency department today for follow-up beta-hCG testing.  Patient states that she was seen in the department 2 days ago, told about likely impending miscarriage.  She reports no current cramping, abdominal pain.  She continues to have moderate vaginal bleeding.  She states that she did note the passage of a larger clear/bloody tissue yesterday that looked different than a normal clot and she is wondering if this could have been fetal tissue.  She denies any fevers, chills or other systemic symptoms.         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 Authorizing Provider  albuterol (VENTOLIN HFA) 108 (90 Base) MCG/ACT inhaler Inhale 1-2 puffs into the lungs every 6 (six) hours as needed for wheezing or shortness of breath. 06/09/19   Payton Mccallum, MD  fluticasone (FLOVENT HFA) 110 MCG/ACT inhaler Inhale into the lungs. 06/23/19 06/22/20  [provider]  Fluticasone-Salmeterol (ADVAIR DISKUS) 100-50 MCG/DOSE AEPB Inhale 1 puff into the lungs 2 (two) times daily. 06/21/19   Renford Dills, NP  Norgestimate-Ethinyl Estradiol Triphasic (ORTHO TRI-CYCLEN LO) 0.18/0.215/0.25 MG-25 MCG tab Take 1 tablet by mouth daily. Patient not taking:  Reported on 01/23/2021 10/04/19   Federico Flake, MD  predniSONE (STERAPRED UNI-PAK 21 TAB) 10 MG (21) TBPK tablet 6 tablets on day 1; decrease by 1 tablet daily until gone. Patient not taking: Reported on 01/23/2021 08/24/19   Tommie Sams, DO  Prenatal Vit-Fe Fumarate-FA (MULTIVITAMIN-PRENATAL) 27-0.8 MG TABS tablet Take 1 tablet by mouth daily at 12 noon. 01/23/21 05/03/21  Federico Flake, MD    Allergies Patient has no known allergies.  No family history on file.  Social History Social History   Tobacco Use   Smoking status: Never   Smokeless tobacco: Never  Vaping Use   Vaping Use: Never used  Substance Use Topics   Alcohol use: Yes    Comment: 1x/mo, last use 12/2020   Drug use: Yes    Types: Marijuana    Comment: last use- 01/16/21    Review of Systems Constitutional: No fever/chills Eyes: No visual changes. ENT: No sore throat. Cardiovascular: Denies chest pain. Respiratory: Denies shortness of breath. Gastrointestinal: No abdominal pain.  No nausea, no vomiting.  No diarrhea.  No constipation. Genitourinary: Negative for dysuria. Musculoskeletal: Negative for back pain. Skin: Negative for rash. Neurological: Negative for headaches, focal weakness or numbness.  ____________________________________________   PHYSICAL EXAM:  VITAL SIGNS: ED Triage Vitals  Enc Vitals Group     BP 02/20/21 1718 110/84     Pulse Rate 02/20/21 1718 74     Resp 02/20/21 1718 17     Temp 02/20/21 1718  98.4 F (36.9 C)     Temp src --      SpO2 02/20/21 1718 100 %     Weight --      Height --      Head Circumference --      Peak Flow --      Pain Score 02/20/21 1700 0     Pain Loc --      Pain Edu? --      Excl. in GC? --    Constitutional: Alert and oriented. Well appearing and in no acute distress. Eyes: Conjunctivae are normal. PERRL. EOMI. Head: Atraumatic. Nose: No congestion/rhinnorhea. Mouth/Throat: Mucous membranes are moist.  Oropharynx  non-erythematous. Neck: No stridor.   Cardiovascular: Normal rate, regular rhythm. Grossly normal heart sounds.  Good peripheral circulation. Respiratory: Normal respiratory effort.  No retractions. Lungs CTAB. Gastrointestinal: Soft and nontender. No distention. No abdominal bruits. No CVA tenderness. Musculoskeletal: No lower extremity tenderness nor edema.  No joint effusions. Neurologic:  Normal speech and language. No gross focal neurologic deficits are appreciated. No gait instability. Skin:  Skin is warm, dry and intact. No rash noted. Psychiatric: Mood and affect are normal. Speech and behavior are normal.  ____________________________________________   LABS (all labs ordered are listed, but only abnormal results are displayed)  Labs Reviewed  HCG, QUANTITATIVE, PREGNANCY - Abnormal; Notable for the following components:      Result Value   hCG, Beta Chain, Quant, S 455 (*)    All other components within normal limits    ____________________________________________   INITIAL IMPRESSION / ASSESSMENT AND PLAN / ED COURSE  As part of my medical decision making, I reviewed the following data within the electronic MEDICAL RECORD NUMBER Nursing notes reviewed and incorporated, Labs reviewed, and Notes from prior ED visits        Patient is a 35 year old female who presents to the emergency department for follow-up from previous exam 2 days ago.  See HPI for further details.  In triage patient has normal vital signs.  Physical exam with no tenderness to the abdomen.  Repeat quantitative hCG is 455, compared to over 4000 about 48 hours ago.  This is consistent with previous suspected threatened miscarriage.  Discussed with the patient that this is consistent with spontaneous pregnancy loss.  The patient acknowledges and took this quite well.  She has follow-up available to her at the health department to ensure resolution.  Return precautions were discussed at length, including any pelvic  or abdominal pain, changes in bleeding, nausea vomiting, fevers or other concerns.  Patient is amenable with this plan, stable at this time for her outpatient follow-up.        ____________________________________________   FINAL CLINICAL IMPRESSION(S) / ED DIAGNOSES  Final diagnoses:  Spontaneous pregnancy loss     ED Discharge Orders     None        Note:  This document was prepared using Dragon voice recognition software and may include unintentional dictation errors.    Lucy Chris, PA 02/20/21 1919    Merwyn Katos, MD 02/20/21 [phone]

## 2021-02-20 NOTE — ED Notes (Signed)
Patient to ED per OB to get hcg rechecked for possible miscarriage. Patient states she's had bright red vaginal bleeding since Sunday similar to a period.

## 2021-02-20 NOTE — Telephone Encounter (Signed)
Consulted on the plan of care for this client.  I agree with the documented note and actions taken to provide care for this client.  E. Meelah Tallo, CNM  

## 2021-02-20 NOTE — Discharge Instructions (Addendum)
Return to the emergency department if you experience any fever, chills, cramping or any other worsening symptoms.  Otherwise, follow-up with the health department as previously instructed.

## 2021-02-20 NOTE — Progress Notes (Signed)
Pt in Nurse Clinic for Beta HCG per order by Hazle Coca, CNM dated 02/19/2021. RN walked pt to lab. Lab tech notified RN that she is unable to do venipuncture d/t congenital anomaly of pt's upper limbs. Hazle Coca, CNM notified of situation and recommends pt go to ER to have Beta HCG drawn. RN explained provider orders and reports understanding. Pt plans to go to ER. Jerel Shepherd, RN

## 2021-02-22 ENCOUNTER — Encounter: Payer: Self-pay | Admitting: Advanced Practice Midwife

## 2021-02-23 ENCOUNTER — Encounter: Payer: Self-pay | Admitting: Advanced Practice Midwife

## 2021-02-23 ENCOUNTER — Telehealth: Payer: Self-pay | Admitting: Advanced Practice Midwife

## 2021-02-23 DIAGNOSIS — O039 Complete or unspecified spontaneous abortion without complication: Secondary | ICD-10-CM | POA: Insufficient documentation

## 2021-02-23 NOTE — Progress Notes (Signed)
Consulted on the plan of care for this client.  I agree with the documented note and actions taken to provide care for this client.  E. Jermisha Hoffart, CNM  

## 2021-02-23 NOTE — Telephone Encounter (Signed)
T/C to pt who went to ER with heavy bleeding and had Bhcg drawn and was sent to [facility] for management and f/u for missed AB. Pt came to [facility] 02/20/21 for Bhcg but our lab was unable to draw her blood due to bilateral congenital arm anomaly so she was sent to [facility] for Bhcg. Values are dropping and was 455 on 02/20/21. Pt was counseled that she needs Bhcg drawn until =0. Pt states she does not want any more blood drawn because had to get blood drawn from ankles and painful procedure. Counseled pt on medical recommendation due to risk of infection/molar pregnancy, etc if not followed to 0. Pt plans to go to ER for u/s and medical management

## 2021-02-26 ENCOUNTER — Telehealth: Payer: Self-pay | Admitting: Family Medicine

## 2021-02-26 ENCOUNTER — Telehealth: Payer: Self-pay

## 2021-02-26 NOTE — Telephone Encounter (Signed)
Consulted with provider regarding pt's request for U/S referral. Provider, Arnetha Courser, CNM, confirmed that pt needs to return to ER for Beta HCG and the U/S pt is requesting. Pt states she has another facility she wants to go to for U/S, and she will either contact another OBGYN or her PCP. RN expressed the importance of following through with Beta HCG.

## 2021-02-26 NOTE — Telephone Encounter (Signed)
Pt called triage needing to make an appointment for a f/up from a miscarriage, can you call and schedule

## 2021-02-26 NOTE — Telephone Encounter (Signed)
Pt. was called on Friday for her to schedule some Lab work due to a possible miscarriage. She is not able to come in for the blood work, but wants to have an ultrasound done and just needs the referral. She was returning Dr. Gwenyth Allegra phone call. Please, call her back as soon as possible. Thanks

## 2021-02-27 ENCOUNTER — Other Ambulatory Visit: Payer: Self-pay | Admitting: Family Medicine

## 2021-02-27 NOTE — Telephone Encounter (Signed)
Per verbal from Dr. Tiburcio Pea, She has ongoing care w [facility] and should be seen there. Her ER records and labs indicate no concern for ectopic, and likely resolving miscarriage (so no emergent care needed here at this time). She has never been seen here (at leastsince 2017).She can schedule elective appt where there is an opening based on herpreference and insurance acceptability   I called and spoke with patient about scheduling appointment. Advised paitent per providers review. Unable to offer same week appointment. Patient states she will call else where.

## 2021-02-28 ENCOUNTER — Other Ambulatory Visit: Payer: Self-pay | Admitting: Family Medicine

## 2021-02-28 DIAGNOSIS — O039 Complete or unspecified spontaneous abortion without complication: Secondary | ICD-10-CM

## 2021-02-28 NOTE — Telephone Encounter (Signed)
Consulted on the plan of care for this client.  I agree with the documented note and actions taken to provide care for this client.  E. Rondy Krupinski, CNM  

## 2021-03-01 NOTE — Progress Notes (Addendum)
Consulted on the plan of care for this client.  I agree with the documented note and actions taken to provide care for this client.  Hazle Coca, CNM T/C to pt earlier in week to discuss recommendations

## 2021-03-02 ENCOUNTER — Other Ambulatory Visit: Payer: Self-pay

## 2021-03-02 ENCOUNTER — Ambulatory Visit
Admission: RE | Admit: 2021-03-02 | Discharge: 2021-03-02 | Disposition: A | Discharge status: Home / Self Care | Payer: Medicare Other | Source: Ambulatory Visit | Attending: Family Medicine | Admitting: Family Medicine

## 2021-03-02 DIAGNOSIS — O039 Complete or unspecified spontaneous abortion without complication: Secondary | ICD-10-CM | POA: Diagnosis not present

## 2021-04-25 ENCOUNTER — Other Ambulatory Visit: Payer: Self-pay

## 2021-04-25 ENCOUNTER — Encounter: Payer: Self-pay | Admitting: Emergency Medicine

## 2021-04-25 ENCOUNTER — Emergency Department
Admission: EM | Admit: 2021-04-25 | Discharge: 2021-04-25 | Disposition: A | Discharge status: Home / Self Care | Payer: Medicare Other | Attending: Emergency Medicine | Admitting: Emergency Medicine

## 2021-04-25 DIAGNOSIS — J45901 Unspecified asthma with (acute) exacerbation: Secondary | ICD-10-CM | POA: Diagnosis not present

## 2021-04-25 DIAGNOSIS — R062 Wheezing: Secondary | ICD-10-CM | POA: Diagnosis present

## 2021-04-25 DIAGNOSIS — Z7951 Long term (current) use of inhaled steroids: Secondary | ICD-10-CM | POA: Diagnosis not present

## 2021-04-25 DIAGNOSIS — J069 Acute upper respiratory infection, unspecified: Secondary | ICD-10-CM | POA: Diagnosis not present

## 2021-04-25 MED ORDER — PREDNISONE 50 MG PO TABS: 50.0000 mg | ORAL_TABLET | Freq: Every day | ORAL | 0 refills | Status: DC

## 2021-04-25 MED ORDER — IPRATROPIUM-ALBUTEROL 0.5-2.5 (3) MG/3ML IN SOLN
3.0000 mL | Freq: Once | RESPIRATORY_TRACT | Status: AC
  Administered 2021-04-25: 3 mL via RESPIRATORY_TRACT
  Filled 2021-04-25: qty 3

## 2021-04-25 NOTE — ED Triage Notes (Signed)
C/O wheezing and productive cough.  No fevers.  AAOx3.  Skin warm and dry. NAD

## 2021-04-25 NOTE — ED Provider Notes (Signed)
[facility] Emergency Department Provider Note   ____________________________________________    I have reviewed the triage vital signs and the nursing notes.   HISTORY  Chief Complaint Wheezing     HPI Monique Roy is a 35 y.o. female with a history of asthma presents with complaints of wheezing.  She is here with daughter who is having upper respiratory infection symptoms.  Patient has similar complaints, they do sleep together.  No fevers, wheezing noted, has used albuterol inhaler at home.  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 Authorizing Provider  albuterol (VENTOLIN HFA) 108 (90 Base) MCG/ACT inhaler Inhale 1-2 puffs into the lungs every 6 (six) hours as needed for wheezing or shortness of breath. 06/09/19   Payton Mccallum, MD  fluticasone (FLOVENT HFA) 110 MCG/ACT inhaler Inhale into the lungs. 06/23/19 06/22/20  [provider]  Fluticasone-Salmeterol (ADVAIR DISKUS) 100-50 MCG/DOSE AEPB Inhale 1 puff into the lungs 2 (two) times daily. 06/21/19   Renford Dills, NP  predniSONE (DELTASONE) 50 MG tablet Take 1 tablet (50 mg total) by mouth daily with breakfast. 04/25/21   Menshew, Charlesetta Ivory, PA-C  Prenatal Vit-Fe Fumarate-FA (MULTIVITAMIN-PRENATAL) 27-0.8 MG TABS tablet Take 1 tablet by mouth daily at 12 noon. 01/23/21 05/03/21  Federico Flake, MD     Allergies Patient has no known allergies.  No family history on file.  Social History Social History    Tobacco Use   Smoking status: Never   Smokeless tobacco: Never  Vaping Use   Vaping Use: Never used  Substance Use Topics   Alcohol use: Yes    Comment: 1x/mo, last use 12/2020   Drug use: Yes    Types: Marijuana    Comment: last use- 01/16/21    Review of Systems  Constitutional: No fever  ENT: No sore throat. Respiratory: Wheezing as above  Gastrointestinal: No abdominal pain.  No nausea, no vomiting.    Musculoskeletal: Negative for b leg swelling Skin: Negative for rash.     ____________________________________________   PHYSICAL EXAM:  VITAL SIGNS: ED Triage Vitals  Enc Vitals Group     BP 04/25/21 1532 99/75     Pulse Rate 04/25/21 1532 79     Resp 04/25/21 1532 16     Temp 04/25/21 1532 98.2 F (36.8 C)     Temp Source 04/25/21 1532 Oral     SpO2 04/25/21 1532 96 %     Weight 04/25/21 1530 72.6 kg (160 lb 0.9 oz)     Height 04/25/21 1530 1.524 m (5')     Head Circumference --      Peak Flow --      Pain Score 04/25/21 1530 0     Pain Loc --      Pain Edu? --      Excl. in GC? --      Constitutional: Alert and oriented. No acute distress. Pleasant and  interactive Eyes: Conjunctivae are normal.   Nose: No congestion/rhinnorhea. Mouth/Throat: Mucous membranes are moist.   Cardiovascular: Normal rate, regular rhythm.  Respiratory: Normal respiratory effort.  No retractions.  Scattered mild wheezes  Musculoskeletal: No lower extremity tenderness nor edema.   Neurologic:  Normal speech and language. No gross focal neurologic deficits are appreciated.   Skin:  Skin is warm, dry and intact. No rash noted.   ____________________________________________   LABS (all labs ordered are listed, but only abnormal results are displayed)  Labs Reviewed - No data to  display ____________________________________________  EKG   ____________________________________________  RADIOLOGY   ____________________________________________   PROCEDURES  Procedure(s) performed: No  Procedures   Critical Care performed: No ____________________________________________   INITIAL IMPRESSION / ASSESSMENT AND PLAN / ED COURSE  Pertinent labs & imaging results that were available during my care of the patient were reviewed by me and considered in my medical decision making (see chart for details).   Patient well-appearing and in no acute distress, overall quite well-appearing.  Scattered wheezes on exam, treated with DuoNeb here, prednisone Rx.    Return precautions discussed, outpatient follow-up as needed   ____________________________________________   FINAL CLINICAL IMPRESSION(S) / ED DIAGNOSES  Final diagnoses:  Exacerbation of asthma, unspecified asthma severity, unspecified whether persistent  Viral URI with cough      NEW MEDICATIONS STARTED DURING THIS VISIT:  Discharge Medication List as of 04/25/2021  3:33 PM     START taking these medications   Details  predniSONE (DELTASONE) 50 MG tablet Take 1 tablet (50 mg total) by mouth daily with breakfast., Starting Wed 04/25/2021, Normal         Note:  This document was prepared using Dragon voice recognition software and may include unintentional dictation errors.    Jene Every, MD 04/25/21 1655

## 2021-06-17 ENCOUNTER — Ambulatory Visit
Admission: EM | Admit: 2021-06-17 | Discharge: 2021-06-17 | Disposition: A | Discharge status: Home / Self Care | Payer: Medicare Other | Attending: Physician Assistant | Admitting: Physician Assistant

## 2021-06-17 ENCOUNTER — Other Ambulatory Visit: Payer: Self-pay

## 2021-06-17 ENCOUNTER — Ambulatory Visit (INDEPENDENT_AMBULATORY_CARE_PROVIDER_SITE_OTHER): Payer: Medicare Other

## 2021-06-17 DIAGNOSIS — R059 Cough, unspecified: Secondary | ICD-10-CM | POA: Diagnosis not present

## 2021-06-17 DIAGNOSIS — Z20822 Contact with and (suspected) exposure to covid-19: Secondary | ICD-10-CM | POA: Insufficient documentation

## 2021-06-17 DIAGNOSIS — J45909 Unspecified asthma, uncomplicated: Secondary | ICD-10-CM | POA: Insufficient documentation

## 2021-06-17 DIAGNOSIS — R509 Fever, unspecified: Secondary | ICD-10-CM | POA: Diagnosis present

## 2021-06-17 DIAGNOSIS — J189 Pneumonia, unspecified organism: Secondary | ICD-10-CM | POA: Diagnosis not present

## 2021-06-17 LAB — RESP PANEL BY RT-PCR (FLU A&B, COVID) ARPGX2
Influenza A by PCR: NEGATIVE
Influenza B by PCR: NEGATIVE
SARS Coronavirus 2 by RT PCR: NEGATIVE

## 2021-06-17 MED ORDER — LEVOFLOXACIN 500 MG PO TABS: 500.0000 mg | ORAL_TABLET | Freq: Every day | ORAL | 0 refills | Status: DC

## 2021-06-17 MED ORDER — PREDNISONE 20 MG PO TABS: 20.0000 mg | ORAL_TABLET | Freq: Every day | ORAL | 0 refills | Status: DC

## 2021-06-17 MED ORDER — ALBUTEROL SULFATE HFA 108 (90 BASE) MCG/ACT IN AERS
1.0000 | INHALATION_SPRAY | Freq: Four times a day (QID) | RESPIRATORY_TRACT | 0 refills | Status: AC | PRN
Start: 2021-06-17 — End: ?

## 2021-06-17 NOTE — ED Notes (Signed)
Patient states that she has been having a cough with fever since Monday.

## 2021-06-17 NOTE — ED Provider Notes (Signed)
[facility]    CSN: [id number] Arrival date & time: 06/17/21  1426      History   Chief Complaint Chief Complaint  Patient presents with   Fever    HPI Monique Roy is a 35 y.o. female who presents due to having cough x 6 days and been having fevers every other day. 101.6 Tuesday, Wed 102.1, Yesterday 102.2  Has not had body aches or HA.  Has hx of asthma.  Her cough has been productive x 3 days with dark yellow mucous.     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 Authorizing Provider  fluticasone (FLOVENT HFA) 110 MCG/ACT inhaler Inhale into the lungs. 06/23/19 06/17/21 Yes [provider]  Fluticasone-Salmeterol (ADVAIR DISKUS) 100-50 MCG/DOSE AEPB Inhale 1 puff into the lungs 2 (two) times daily. 06/21/19  Yes Renford Dills, NP  levofloxacin (LEVAQUIN) 500 MG tablet Take 1 tablet (500 mg total) by mouth daily. 06/17/21  Yes Rodriguez-Southworth, Nettie Elm, PA-C  predniSONE (DELTASONE) 20 MG tablet Take 1 tablet (20 mg total) by mouth daily with breakfast. 06/17/21  Yes Rodriguez-Southworth, Nettie Elm, PA-C  albuterol (VENTOLIN HFA) 108 (90 Base) MCG/ACT inhaler Inhale 1-2 puffs into the lungs every 6 (six) hours as needed for wheezing or shortness of breath. 06/17/21   Rodriguez-Southworth, Nettie Elm, PA-C     Family History History reviewed. No pertinent family history.  Social History Social History   Tobacco Use   Smoking status: Never   Smokeless tobacco: Never  Vaping Use   Vaping Use: Never used  Substance Use Topics   Alcohol use: Yes    Comment: 1x/mo, last use 12/2020   Drug use: Yes    Types: Marijuana    Comment: last use- 01/16/21     Allergies   Patient has no known allergies.   Review of Systems Review of Systems  Constitutional:  Positive for chills, fatigue and fever. Negative for appetite change and diaphoresis.  HENT:  Positive for congestion, postnasal drip and rhinorrhea. Negative for ear discharge, ear pain, sore throat and trouble swallowing.   Eyes:  Negative for discharge.  Respiratory:  Positive for cough and wheezing. Negative for chest tightness.   Cardiovascular:  Negative for chest pain.  Musculoskeletal:  Negative for myalgias.  Skin:  Negative for rash.  Neurological:  Negative for headaches.  Hematological:  Negative for adenopathy.    Physical Exam Triage Vital Signs ED Triage Vitals  Enc Vitals Group  BP 06/17/21 1508 110/77     Pulse Rate 06/17/21 1508 61     Resp 06/17/21 1508 18     Temp 06/17/21 1508 98.7 F (37.1 C)     Temp Source 06/17/21 1508 Oral     SpO2 06/17/21 1508 96 %     Weight 06/17/21 1505 146 lb 12.8 oz (66.6 kg)     Height 06/17/21 1505 5' (1.524 m)     Head Circumference --      Peak Flow --      Pain Score 06/17/21 1506 0     Pain Loc --      Pain Edu? --      Excl. in GC? --    No data found.  Updated Vital Signs BP 110/77 (BP Location: Right Arm)   Pulse 61   Temp 98.7 F (37.1 C) (Oral)   Resp 18   Ht 5' (1.524 m)   Wt 146 lb 12.8 oz (66.6 kg)   LMP 05/23/2021   SpO2 96%   Breastfeeding No   BMI 28.67 kg/m   Visual Acuity Right Eye Distance:   Left Eye Distance:   Bilateral Distance:    Right Eye Near:   Left Eye Near:    Bilateral Near:     Physical Exam Physical  Exam Constitutional:      General: SHe is not in acute distress.    Appearance: SHe is not toxic-appearing.  HENT:     Head: Normocephalic.     Right Ear: TM's not visible due to cerumen impaction    Left Ear: TM's not visible due to cerumen impaction    Nose: Nose normal.     Mouth/Throat: clear    Mouth: Mucous membranes are moist.     Pharynx: Oropharynx is clear.  Eyes:     General: No scleral icterus.    Conjunctiva/sclera: Conjunctivae normal.  Cardiovascular:     Rate and Rhythm: Normal rate and regular rhythm.     Heart sounds: No murmur heard.   Pulmonary:     Effort: Pulmonary effort is normal. No respiratory distress.     Breath sounds: Wheezing present.     Comments: Has auditory wheezing Musculoskeletal:        General: Normal range of motion.     Cervical back: Neck supple.  Lymphadenopathy:     Cervical: No cervical adenopathy.  Skin:    General: Skin is warm and dry.     Findings: No rash.  Neurological:     Mental Status: SHe is alert and oriented to person, place, and time.     Gait: Gait normal.  Psychiatric:        Mood and Affect: Mood normal.        Behavior: Behavior normal.        Thought Content: Thought content normal.        Judgment: Judgment normal.    UC Treatments / Results  Labs (all labs ordered are listed, but only abnormal results are displayed) Labs Reviewed  RESP PANEL BY RT-PCR (FLU A&B, COVID) ARPGX2  Repiratory panel is neg.   EKG   Radiology DG Chest 2 View  Result Date: 06/17/2021 CLINICAL DATA:  Cough and fever. EXAM: CHEST - 2 VIEW COMPARISON:  May 23, 2019 FINDINGS: There is mild opacity at the right lung base. There is no pleural effusion or pulmonary edema. The mediastinal contour and cardiac silhouette are normal. Scoliosis of the spine is identified. IMPRESSION: Mild opacity  at the right lung base, developing pneumonia is not excluded. Electronically Signed   By: Sherian Rein M.D.   On: 06/17/2021 16:07     Procedures Procedures (including critical care time)  Medications Ordered in UC Medications - No data to display  Initial Impression / Assessment and Plan / UC Course  I have reviewed the triage vital signs and the nursing notes. Pertinent labs & imaging results that were available during my care of the patient were reviewed by me and considered in my medical decision making (see chart for details). RLL pneumonia I placed her on Levaquin, Albuterol, and prednisone as noted. See instructions.   Final Clinical Impressions(s) / UC Diagnoses   Final diagnoses:  Pneumonia of right lower lobe due to infectious organism     Discharge Instructions      Your chest xray shows you may be getting a pneumonia on your right lower lung. Make sure to have a repeated chest xray in 6-8 weeks to make sure that area has fully cleared      ED Prescriptions     Medication Sig Dispense Auth. Provider   levofloxacin (LEVAQUIN) 500 MG tablet Take 1 tablet (500 mg total) by mouth daily. 7 tablet Rodriguez-Southworth, Nettie Elm, PA-C   predniSONE (DELTASONE) 20 MG tablet Take 1 tablet (20 mg total) by mouth daily with breakfast. 5 tablet Rodriguez-Southworth, Nettie Elm, PA-C   albuterol (VENTOLIN HFA) 108 (90 Base) MCG/ACT inhaler Inhale 1-2 puffs into the lungs every 6 (six) hours as needed for wheezing or shortness of breath. 18 g Rodriguez-Southworth, Nettie Elm, PA-C      PDMP not reviewed this encounter.   Garey Ham, PA-C 06/17/21 1624

## 2021-06-17 NOTE — Discharge Instructions (Addendum)
Your chest xray shows you may be getting a pneumonia on your right lower lung. Make sure to have a repeated chest xray in 6-8 weeks to make sure that area has fully cleared

## 2021-10-09 ENCOUNTER — Other Ambulatory Visit: Payer: Self-pay

## 2021-10-09 ENCOUNTER — Encounter: Payer: Self-pay | Admitting: Family Medicine

## 2021-10-09 ENCOUNTER — Ambulatory Visit (LOCAL_COMMUNITY_HEALTH_CENTER): Payer: Medicare Other | Admitting: Family Medicine

## 2021-10-09 VITALS — BP 110/62 | Ht 61.0 in | Wt 149.2 lb

## 2021-10-09 DIAGNOSIS — Z0000 Encounter for general adult medical examination without abnormal findings: Secondary | ICD-10-CM | POA: Diagnosis not present

## 2021-10-09 DIAGNOSIS — Z309 Encounter for contraceptive management, unspecified: Secondary | ICD-10-CM | POA: Diagnosis not present

## 2021-10-09 DIAGNOSIS — Z3009 Encounter for other general counseling and advice on contraception: Secondary | ICD-10-CM

## 2021-10-09 DIAGNOSIS — Z30011 Encounter for initial prescription of contraceptive pills: Secondary | ICD-10-CM

## 2021-10-09 MED ORDER — NORGESTIM-ETH ESTRAD TRIPHASIC 0.18/0.215/0.25 MG-25 MCG PO TABS: 1.0000 | ORAL_TABLET | Freq: Every day | ORAL | 12 refills | Status: AC

## 2021-10-09 NOTE — Progress Notes (Signed)
[facility] DEPARTMENT ?[facility] ?[address] [employer] ?Main Number: [phone] ? ?Family Planning Visit- Repeat Yearly Visit ? ?Subjective:  ?Monique Roy is a 36 y.o. [phone]  being seen today for an annual wellness visit and to discuss contraception options.   The patient is currently using No Method - No Contraceptive Precautions for pregnancy prevention. Patient does not want a pregnancy in the next year. Patient has the following medical problems: has Congenital anomaly of upper limbs; Morbid obesity (HCC) BMI=31.2; and Miscarriage on their problem list. ? ?Chief Complaint  ?Patient presents with  ? Annual Exam  ? Contraception  ?  option  ? ? ?Patient reports here for physical and BC.  ? ?Patient denies any problems or concerns   ? ?See flowsheet for other program required questions.  ? ?Body mass index is 28.19 kg/m?. - Patient is eligible for diabetes screening based on BMI and age >20?  not applicable ?HA1C ordered? not applicable ? ?Patient reports 1 of partners in last year. Desires STI screening?  No - declined  ? ? ?Has patient been screened once for HCV in the past?  No ? No results found for: HCVAB ? ?Does the patient have current of drug use, have a partner with drug use, and/or has been incarcerated since last result? Yes  ?If yes-- Screen for HCV through [facility] ?  ?Does the patient meet criteria for HBV testing? Yes ? ?Criteria:  ?-Household, sexual or needle sharing contact with HBV ?-History of drug use ?-HIV positive ?-Those with known Hep C ? ? ?Health Maintenance Due  ?Topic Date Due  ? COVID-19 Vaccine (1) Never done  ? HIV Screening  Never done  ? Hepatitis C Screening  Never done  ? PAP SMEAR-Modifier  Never done  ? INFLUENZA VACCINE  Never done  ? ? ?Review of Systems  ?Constitutional:  Negative for chills, fever, malaise/fatigue and weight loss.  ?HENT:  Negative for congestion, hearing loss and sore throat.   ?Eyes:  Negative for blurred vision,  double vision and photophobia.  ?Respiratory:  Negative for shortness of breath.   ?Cardiovascular:  Negative for chest pain.  ?Gastrointestinal:  Negative for abdominal pain, blood in stool, constipation, diarrhea, heartburn, nausea and vomiting.  ?Genitourinary:  Negative for dysuria and frequency.  ?Musculoskeletal:  Negative for back pain, joint pain and neck pain.  ?Skin:  Negative for itching and rash.  ?Neurological:  Negative for dizziness, weakness and headaches.  ?Endo/Heme/Allergies:  Does not bruise/bleed easily.  ?Psychiatric/Behavioral:  Negative for depression, substance abuse and suicidal ideas.   ? ?The following portions of the patient's history were reviewed and updated as appropriate: allergies, current medications, past family history, past medical history, past social history, past surgical history and problem list. Problem list updated. ? ?Objective:  ? ?Vitals:  ? 10/09/21 1029  ?BP: 110/62  ?Weight: 149 lb 3.2 oz (67.7 kg)  ?Height: 5\' 1"  (1.549 m)  ? ? ?Physical Exam ?Vitals and nursing note reviewed.  ?Constitutional:   ?   Appearance: Normal appearance.  ?HENT:  ?   Head: Normocephalic and atraumatic.  ?   Mouth/Throat:  ?   Mouth: Mucous membranes are moist.  ?   Pharynx: No oropharyngeal exudate or posterior oropharyngeal erythema.  ?Eyes:  ?   General: No scleral icterus. ?Neck:  ?   Thyroid: No thyroid mass, thyromegaly or thyroid tenderness.  ?Cardiovascular:  ?   Rate and Rhythm: Normal rate and regular rhythm.  ?  Pulses: Normal pulses.  ?   Heart sounds: Normal heart sounds.  ?Pulmonary:  ?   Effort: Pulmonary effort is normal.  ?   Breath sounds: Normal breath sounds.  ?Chest:  ?Breasts: ?   Tanner Score is 5.  ?   Right: Normal. No swelling, inverted nipple, mass or tenderness.  ?   Left: Normal. No swelling, inverted nipple, mass or tenderness.  ?Abdominal:  ?   General: Abdomen is flat. Bowel sounds are normal.  ?   Palpations: Abdomen is soft.  ?Genitourinary: ?   Comments:  Deferred  ?Musculoskeletal:     ?   General: Normal range of motion.  ?   Cervical back: Normal range of motion and neck supple.  ?   Comments: Congential anomaly of bilateral lower arms   ?Lymphadenopathy:  ?   Upper Body:  ?   Right upper body: No axillary adenopathy.  ?   Left upper body: No axillary adenopathy.  ?Skin: ?   General: Skin is warm and dry.  ?Neurological:  ?   General: No focal deficit present.  ?   Mental Status: She is alert and oriented to person, place, and time.  ?Psychiatric:     ?   Behavior: Behavior normal.  ? ? ? ? ?Assessment and Plan:  ?Monique Roy is a 36 y.o. female [phone] presenting to the [facility] Department for an yearly wellness and contraception visit ? ? ?1. Routine general medical examination at a health care facility ?Well woman exam today  ?CBE today  ?Pap done 04/12/2021- NIL and HPV negative - [facility]  ? ?2. Family planning Counseling  ? ?Contraception counseling: Reviewed all forms of birth control options in the tiered based approach. available including abstinence; over the counter/barrier methods; hormonal contraceptive medication including pill, patch, ring, injection,contraceptive implant, ECP; hormonal and nonhormonal IUDs; permanent sterilization options including vasectomy and the various tubal sterilization modalities. Risks, benefits, and typical effectiveness rates were reviewed.  Questions were answered.  Written information was also given to the patient to review.  Patient desires OCPs, this was prescribed for patient. She will follow up in  as needed  for surveillance.  ? ?She  was told to call with any further questions, or with any concerns about this method of contraception.  Emphasized use of condoms 100% of the time for STI prevention. ? ?Patient was not  offered ECP based on last sex.  Pt to take PT in 2 weeks and report back to clinic if positive  ? ? ?3. Encounter for initial prescription of contraceptive pills ?Pt desires OCPs for  BCM  ?- Norgestimate-Ethinyl Estradiol Triphasic 0.18/0.215/0.25 MG-25 MCG tab; Take 1 tablet by mouth daily.  Dispense: 28 tablet; Refill: 12 ? ? ? ? ?Return in about 1 year (around 10/10/2022) for annual well woman exam. ? ?No future appointments. ? ?Wendi Snipes, FNP ?

## 2021-10-09 NOTE — Progress Notes (Signed)
Patient seen today for PE and OCP. Providers orders completed.  ?

## 2021-11-26 ENCOUNTER — Ambulatory Visit: Payer: Medicare Other

## 2022-06-21 ENCOUNTER — Ambulatory Visit: Payer: Medicare Other | Admitting: Nurse Practitioner

## 2022-06-21 DIAGNOSIS — Z113 Encounter for screening for infections with a predominantly sexual mode of transmission: Secondary | ICD-10-CM

## 2022-06-21 LAB — WET PREP FOR TRICH, YEAST, CLUE
Trichomonas Exam: NEGATIVE
Yeast Exam: NEGATIVE

## 2022-06-21 NOTE — Progress Notes (Signed)
Pt appointment for STI Screening. Seen by FNP White. Initial lab results negative and reviewed with pt.

## 2022-06-21 NOTE — Progress Notes (Signed)
[facility] Department  STI clinic/screening visit [place] [address] [phone]  Subjective:  Monique Roy is a 36 y.o. female being seen today for an STI screening visit. The patient reports they do not have symptoms.  Patient reports that they do not desire a pregnancy in the next year.   They reported they are not interested in discussing contraception today.    Patient's last menstrual period was 05/19/2022 (approximate).   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  Patient reports that she is asymptomatic.   Does the patient using douching products? No  Last HIV test per patient/review of record was: 2020 Patient reports last pap was: 2023  Screening for MPX risk: Does the patient have an unexplained rash? No Is the patient MSM? No Does the patient endorse multiple sex partners or anonymous sex partners? No Did the patient have close or sexual contact with a person diagnosed with MPX? No Has the patient traveled outside the [place] where MPX is endemic? No Is there a high clinical suspicion for MPX-- evidenced by one of the following No  -Unlikely to be chickenpox  -Lymphadenopathy  -Rash that present in same phase of evolution on any given body part See flowsheet for further details and programmatic requirements.   Immunization history:  Immunization History  Administered Date(s) Administered   Hepatitis B 03/27/1998, 04/27/1998, 08/27/1998, 10/02/1998   MMR 11/29/1987, 01/31/1992, 04/07/1998   Tdap 01/04/2011, 05/08/2016     The following portions of the patient's history were reviewed and updated as  appropriate: allergies, current medications, past medical history, past social history, past surgical history and problem list.  Objective:  There were no vitals filed for this visit.  Physical Exam Constitutional:      Appearance: Normal appearance.  HENT:     Head: Normocephalic. No abrasion, masses or laceration. Hair is normal.     Right Ear: External ear normal.     Left Ear: External ear normal.     Nose: Nose normal.     Mouth/Throat:     Lips: Pink.     Mouth: Mucous membranes are moist. No oral lesions.     Pharynx: No oropharyngeal exudate or posterior oropharyngeal erythema.     Tonsils: No tonsillar exudate or tonsillar abscesses.  Eyes:     General: Lids are normal.        Right eye: No discharge.        Left eye: No discharge.     Conjunctiva/sclera: Conjunctivae normal.     Right eye: No exudate.    Left eye: No exudate. Abdominal:     General: Abdomen is flat.     Palpations: Abdomen is soft.     Tenderness: There is no abdominal tenderness. There is no rebound.  Genitourinary:    Pubic Area: No rash or pubic lice.      Labia:        Right: No rash, tenderness, lesion or injury.        Left: No rash, tenderness, lesion or injury.      Vagina: Normal. No vaginal discharge, erythema or lesions.  Cervix: No cervical motion tenderness, discharge, lesion or erythema.     Uterus: Not enlarged and not tender.      Rectum: Normal.     Comments: Amount Discharge: small  pH: less than 4.5 Adheres to vaginal wall: No Color: Monique Roy Musculoskeletal:     Cervical back: Full passive range of motion without pain, normal range of motion and neck supple.     Comments: Congenital limb anomaly   Lymphadenopathy:     Cervical: No cervical adenopathy.     Right cervical: No superficial, deep or posterior cervical adenopathy.    Left cervical: No superficial, deep or posterior cervical adenopathy.     Upper Body:     Right upper body: No supraclavicular, axillary or  epitrochlear adenopathy.     Left upper body: No supraclavicular, axillary or epitrochlear adenopathy.     Lower Body: No right inguinal adenopathy. No left inguinal adenopathy.  Skin:    General: Skin is warm and dry.     Findings: No lesion or rash.     Comments: Right upper forearm dermatitis noted   Neurological:     Mental Status: She is alert and oriented to person, place, and time.  Psychiatric:        Attention and Perception: Attention normal.        Mood and Affect: Mood normal.        Speech: Speech normal.        Behavior: Behavior normal. Behavior is cooperative.      Assessment and Plan:  Monique Roy is a 36 y.o. female presenting to the [facility] Department for STI screening  1. Screening examination for venereal disease -36 year old female in clinic today for STD screening. -Patient accepted all screenings including oral GC, vaginal CT/GC, wet prep and bloodwork for HIV/RPR.  Patient meets criteria for HepB screening? No. Ordered? No - low risk Patient meets criteria for HepC screening? Yes. Ordered? Yes  Treat wet prep per standing order Discussed time line for [facility] results and that patient will be called with positive results and encouraged patient to call if she had not heard in 2 weeks.  Counseled to return or seek care for continued or worsening symptoms Recommended condom use with all sex  Patient is currently using Hormonal Contraception: Injection, Rings and Patches to prevent pregnancy.    -Recommended patient to continue with application of Hydrocortisone to right upper forearm.     - Chlamydia/Gonorrhea [place] [facility] - WET PREP FOR TRICH, YEAST, CLUE - Gonococcus culture    Total time spent: 30 minutes   Return if symptoms worsen or fail to improve.    Gregary Cromer, FNP

## 2022-06-25 ENCOUNTER — Ambulatory Visit
Admission: EM | Admit: 2022-06-25 | Discharge: 2022-06-25 | Disposition: A | Discharge status: Home / Self Care | Payer: Medicare Other | Attending: Physician Assistant | Admitting: Physician Assistant

## 2022-06-25 DIAGNOSIS — M545 Low back pain, unspecified: Secondary | ICD-10-CM | POA: Diagnosis not present

## 2022-06-25 MED ORDER — NAPROXEN 500 MG PO TABS: 500.0000 mg | ORAL_TABLET | Freq: Two times a day (BID) | ORAL | 0 refills | Status: DC | PRN

## 2022-06-25 MED ORDER — BACLOFEN 10 MG PO TABS: 10.0000 mg | ORAL_TABLET | Freq: Three times a day (TID) | ORAL | 0 refills | Status: DC | PRN

## 2022-06-25 NOTE — ED Provider Notes (Signed)
[facility]    CSN: [id number] Arrival date & time: 06/25/22  1231      History   Chief Complaint Chief Complaint  Patient presents with   Back Pain    HPI Monique Roy is a 36 y.o. female presenting for right lower back pain for the past 1 to 2 weeks.  She says the pain is somewhat of a pressure and is constant.  Pain does not radiate.  It is worse with movement especially lateral flexion to the right and forward flexion.  She denies any injury.  She says she has not any numbness, weakness or tingling.  She has taken Tylenol which has not helped.  She also reports that she was sick for the past couple of weeks with a cough and congestion but those symptoms have resolved.  She reports coughing pretty forcefully and frequently recently.  Patient has a history of sciatica on the left.  She says that it is not currently bothering her.  She denies any fever, fatigue, shortness of breath, continued or worsening cough, dysuria, urinary frequency urgency, hematuria.  No other complaints.  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 Authorizing Provider  albuterol (VENTOLIN HFA) 108 (90 Base) MCG/ACT inhaler Inhale 1-2 puffs into the lungs every 6 (six) hours as needed for wheezing or shortness of breath. 06/17/21  Yes  Rodriguez-Southworth, Sunday Spillers, PA-C  baclofen (LIORESAL) 10 MG tablet Take 1 tablet (10 mg total) by mouth 3 (three) times daily as needed for muscle spasms. 06/25/22  Yes Laurene Footman B, PA-C  naproxen (NAPROSYN) 500 MG tablet Take 1 tablet (500 mg total) by mouth 2 (two) times daily as needed for moderate pain. 06/25/22  Yes Danton Clap, PA-C  Norgestimate-Ethinyl Estradiol Triphasic 0.18/0.215/0.25 MG-25 MCG tab Take 1 tablet by mouth daily. 10/09/21  Yes Junious Dresser, FNP  fluticasone (FLOVENT HFA) 110 MCG/ACT inhaler Inhale into the lungs. 06/23/19 06/21/22  [provider]  Fluticasone-Salmeterol (ADVAIR DISKUS) 100-50 MCG/DOSE AEPB Inhale 1 puff into the lungs 2 (two) times daily. Patient not taking: Reported on 06/21/2022 06/21/19   Marylene Land, NP    Family History History reviewed. No pertinent family history.  Social History Social History   Tobacco Use   Smoking status: Never   Smokeless tobacco: Never  Vaping Use   Vaping Use: Never used  Substance Use Topics   Alcohol use: Yes    Comment: occasionally   Drug use:  Yes    Types: Marijuana    Comment: daily     Allergies   Patient has no known allergies.   Review of Systems Review of Systems  Constitutional:  Negative for fatigue and fever.  HENT:  Negative for congestion.   Respiratory:  Negative for cough and shortness of breath.   Cardiovascular:  Negative for chest pain.  Gastrointestinal:  Negative for abdominal pain, diarrhea, nausea and vomiting.  Genitourinary:  Negative for dysuria, flank pain and hematuria.  Musculoskeletal:  Positive for back pain. Negative for gait problem.  Skin:  Negative for color change and wound.  Neurological:  Negative for weakness and numbness.     Physical Exam Triage Vital Signs ED Triage Vitals  Enc Vitals Group     BP      Pulse      Resp      Temp      Temp src      SpO2      Weight      Height      Head Circumference      Peak Flow       Pain Score      Pain Loc      Pain Edu?      Excl. in [place]?    No data found.  Updated Vital Signs BP 116/88 (BP Location: Left Arm)   Pulse 61   Temp 98.5 F (36.9 C) (Oral)   Resp 18   Ht 5' (1.524 m)   Wt 140 lb (63.5 kg)   LMP 05/19/2022 (Approximate)   SpO2 98%   BMI 27.34 kg/m       Physical Exam Vitals and nursing note reviewed.  Constitutional:      General: She is not in acute distress.    Appearance: Normal appearance. She is not ill-appearing or toxic-appearing.  HENT:     Head: Normocephalic and atraumatic.     Nose: Nose normal.     Mouth/Throat:     Mouth: Mucous membranes are moist.     Pharynx: Oropharynx is clear.  Eyes:     General: No scleral icterus.       Right eye: No discharge.        Left eye: No discharge.     Conjunctiva/sclera: Conjunctivae normal.  Cardiovascular:     Rate and Rhythm: Normal rate and regular rhythm.     Heart sounds: Normal heart sounds.  Pulmonary:     Effort: Pulmonary effort is normal. No respiratory distress.     Breath sounds: Normal breath sounds. No wheezing, rhonchi or rales.  Abdominal:     Tenderness: There is no right CVA tenderness or left CVA tenderness.  Musculoskeletal:     Cervical back: Neck supple.     Lumbar back: Tenderness (right thoracolumbar paraspinal muscle tenderness and upper lumbar muscle tenderness) present. No bony tenderness. Decreased range of motion. Negative right straight leg raise test and negative left straight leg raise test.  Skin:    General: Skin is dry.  Neurological:     General: No focal deficit present.     Mental Status: She is alert. Mental status is at baseline.     Motor: No weakness.     Gait: Gait normal.  Psychiatric:        Mood and Affect: Mood normal.        Behavior: Behavior normal.        Thought Content: Thought content normal.  UC Treatments / Results  Labs (all labs ordered are listed, but only abnormal results are displayed) Labs Reviewed - No  data to display  EKG   Radiology No results found.  Procedures Procedures (including critical care time)  Medications Ordered in UC Medications - No data to display  Initial Impression / Assessment and Plan / UC Course  I have reviewed the triage vital signs and the nursing notes.  Pertinent labs & imaging results that were available during my care of the patient were reviewed by me and considered in my medical decision making (see chart for details).   36 year old female presents for right upper lumbar/thoracolumbar pain for the past 1 to 2 weeks.  Denies any injury.  Pain is a mild to moderate pressure and is constant.  It is worse with lateral flexion to the right side and forward flexion.  No radiation of pain.  No numbness, weakness or tingling.  No fever, fatigue, shortness of breath.  No urinary symptoms.  Vitals normal and stable.  Patient overall well-appearing.  On exam her chest clear to auscultation and heart regular rate and rhythm.  No CVA tenderness.  She does have TTP right thoracolumbar and upper lumbar paraspinal muscles.  Reduced range of motion with forward flexion and right lateral flexion.  Painful movement.  Negative straight leg raise bilaterally.  Full strength the lower extremities.  Suspect lumbar muscle strain.  Treating with naproxen and baclofen.  Reviewed use of heat, ice, topical muscle rubs, lidocaine patch.  Since she is no longer coughing, I expect this should improve over the next week.  Advised if she does develop a fever, return or worsening cough, shortness of breath or has any urinary symptoms to return for reevaluation but low suspicion for pneumonia, renal stone, pyelonephritis, etc.   Final Clinical Impressions(s) / UC Diagnoses   Final diagnoses:  Acute right-sided low back pain without sciatica     Discharge Instructions      BACK PAIN: You likely have a muscles strain. Stressed avoiding painful activities . RICE (REST, ICE,  COMPRESSION, ELEVATION) guidelines reviewed. May alternate ice and heat. Consider use of muscle rubs, Salonpas patches, etc. Use medications as directed including muscle relaxers if prescribed. Take anti-inflammatory medications as prescribed or OTC NSAIDs/Tylenol.  F/u with PCP in 7-10 days for reexamination, and please feel free to call or return to the urgent care at any time for any questions or concerns you may have and we will be happy to help you!   BACK PAIN RED FLAGS: If the back pain acutely worsens or there are any red flag symptoms such as numbness/tingling, leg weakness, saddle anesthesia, or loss of bowel/bladder control, go immediately to the ER. Follow up with [place] as scheduled or sooner if the pain does not begin to resolve or if it worsens before the follow up   -If you develop a fever or have a return of cough, shortness of breath or if you develop any urinary symptoms, please return for reevaluation     ED Prescriptions     Medication Sig Dispense Auth. Provider   naproxen (NAPROSYN) 500 MG tablet Take 1 tablet (500 mg total) by mouth 2 (two) times daily as needed for moderate pain. 30 tablet Eusebio Friendly B, PA-C   baclofen (LIORESAL) 10 MG tablet Take 1 tablet (10 mg total) by mouth 3 (three) times daily as needed for muscle spasms. 20 each Shirlee Latch, PA-C      I have reviewed  the PDMP during this encounter.   Danton Clap, PA-C 06/25/22 1348

## 2022-06-25 NOTE — Discharge Instructions (Addendum)
BACK PAIN: You likely have a muscles strain. Stressed avoiding painful activities . RICE (REST, ICE, COMPRESSION, ELEVATION) guidelines reviewed. May alternate ice and heat. Consider use of muscle rubs, Salonpas patches, etc. Use medications as directed including muscle relaxers if prescribed. Take anti-inflammatory medications as prescribed or OTC NSAIDs/Tylenol.  F/u with PCP in 7-10 days for reexamination, and please feel free to call or return to the urgent care at any time for any questions or concerns you may have and we will be happy to help you!   BACK PAIN RED FLAGS: If the back pain acutely worsens or there are any red flag symptoms such as numbness/tingling, leg weakness, saddle anesthesia, or loss of bowel/bladder control, go immediately to the ER. Follow up with [place] as scheduled or sooner if the pain does not begin to resolve or if it worsens before the follow up   -If you develop a fever or have a return of cough, shortness of breath or if you develop any urinary symptoms, please return for reevaluation

## 2022-06-25 NOTE — ED Triage Notes (Signed)
Pt c/o right side back pain x1-2weeks.  Pt states that she wakes up with pain and takes tylenol for the pain. Pt last used tylenol this morning and took 1000mg .  Pt states she had a cold on 06/05/22 for 3 weeks and symptoms left but she is still having back pain.  Pt denies any urinary issues, vaginal discharge or odor, diarrhea, nausea, vomiting, or new physical activity.

## 2022-06-26 LAB — GONOCOCCUS CULTURE

## 2022-07-01 ENCOUNTER — Ambulatory Visit
Admission: EM | Admit: 2022-07-01 | Discharge: 2022-07-01 | Disposition: A | Discharge status: Home / Self Care | Payer: Medicare Other | Attending: Urgent Care | Admitting: Urgent Care

## 2022-07-01 DIAGNOSIS — M545 Low back pain, unspecified: Secondary | ICD-10-CM | POA: Diagnosis present

## 2022-07-01 LAB — URINALYSIS, ROUTINE W REFLEX MICROSCOPIC
Bilirubin Urine: NEGATIVE
Glucose, UA: NEGATIVE mg/dL
Hgb urine dipstick: NEGATIVE
Ketones, ur: NEGATIVE mg/dL
Leukocytes,Ua: NEGATIVE
Nitrite: NEGATIVE
Protein, ur: NEGATIVE mg/dL
Specific Gravity, Urine: 1.02 (ref 1.005–1.030)
pH: 6.5 (ref 5.0–8.0)

## 2022-07-01 MED ORDER — CARISOPRODOL 350 MG PO TABS: 350.0000 mg | ORAL_TABLET | Freq: Three times a day (TID) | ORAL | 0 refills | Status: DC

## 2022-07-01 MED ORDER — CARISOPRODOL 350 MG PO TABS
350.0000 mg | ORAL_TABLET | Freq: Three times a day (TID) | ORAL | 0 refills | Status: AC
Start: 2022-07-01 — End: ?

## 2022-07-01 MED ORDER — DICLOFENAC SODIUM 75 MG PO TBEC: 75.0000 mg | DELAYED_RELEASE_TABLET | Freq: Two times a day (BID) | ORAL | 0 refills | Status: AC

## 2022-07-01 NOTE — ED Provider Notes (Signed)
[facility]    CSN: [id number] Arrival date & time: 07/01/22  0845      History   Chief Complaint Chief Complaint  Patient presents with   Back Pain    HPI Evelise Reine is a 36 y.o. female.   Pleasant 36 year old female presents today for follow-up on her back pain.  She was seen several days ago and diagnosed with a musculoskeletal cause of her back pain.  She was discharged home on naproxen and baclofen.  Feels that this has been ineffective.  States it is relatively point tender to her right thoracolumbar region.  Feels that it is swollen and knotted.  She denies any urinary symptoms or hematuria.  No fever.  She denies any injury to the area.  She states the only relief she gets is leaning forward at about a 90 degree angle. Denies bowel or bladder incontinence, radicular symptoms, saddle anesthesia.   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 Authorizing Provider  diclofenac (VOLTAREN) 75 MG EC tablet Take 1 tablet (75 mg total) by mouth 2 (two) times daily with a meal for 7 days. 07/01/22 07/08/22 Yes Jeffry Vogelsang L, PA  albuterol (VENTOLIN HFA) 108 (90 Base) MCG/ACT inhaler Inhale 1-2 puffs into the lungs every 6 (six) hours as needed for wheezing or shortness of breath. 06/17/21   Rodriguez-Southworth,  Nettie Elm, PA-C  carisoprodol (SOMA) 350 MG tablet Take 1 tablet (350 mg total) by mouth 3 (three) times daily. 07/01/22   Halli Equihua L, PA  fluticasone (FLOVENT HFA) 110 MCG/ACT inhaler Inhale into the lungs. 06/23/19 06/21/22  [provider]  Fluticasone-Salmeterol (ADVAIR DISKUS) 100-50 MCG/DOSE AEPB Inhale 1 puff into the lungs 2 (two) times daily. Patient not taking: Reported on 06/21/2022 06/21/19   Renford Dills, NP  Norgestimate-Ethinyl Estradiol Triphasic 0.18/0.215/0.25 MG-25 MCG tab Take 1 tablet by mouth daily. 10/09/21   Wendi Snipes, FNP    Family History History reviewed. No pertinent family history.  Social History Social History   Tobacco Use   Smoking status: Never   Smokeless tobacco: Never  Vaping Use   Vaping Use: Never used  Substance Use Topics   Alcohol use: Yes    Comment: occasionally   Drug use: Yes    Types: Marijuana    Comment: daily     Allergies   Patient has no known allergies.   Review of Systems Review of Systems  Musculoskeletal:  Positive  for back pain.     Physical Exam Triage Vital Signs ED Triage Vitals  Enc Vitals Group     BP 07/01/22 0913 110/76     Pulse Rate 07/01/22 0913 70     Resp --      Temp 07/01/22 0913 98.3 F (36.8 C)     Temp Source 07/01/22 0913 Oral     SpO2 --      Weight 07/01/22 0912 130 lb (59 kg)     Height 07/01/22 0912 5' (1.524 m)     Head Circumference --      Peak Flow --      Pain Score 07/01/22 0912 10     Pain Loc --      Pain Edu? --      Excl. in [place]? --    No data found.  Updated Vital Signs BP 110/76 (BP Location: Left Arm)   Pulse 70   Temp 98.3 F (36.8 C) (Oral)   Ht 5' (1.524 m)   Wt 130 lb (59 kg)   LMP 06/12/2022 (Approximate)   BMI 25.39 kg/m   Visual Acuity Right Eye Distance:   Left Eye Distance:   Bilateral Distance:    Right Eye Near:   Left Eye Near:    Bilateral Near:     Physical Exam Vitals and nursing note reviewed.  Constitutional:       General: She is not in acute distress.    Appearance: Normal appearance. She is not ill-appearing or toxic-appearing.  HENT:     Head: Normocephalic and atraumatic.     Nose: Nose normal.     Mouth/Throat:     Mouth: Mucous membranes are moist.     Pharynx: Oropharynx is clear.  Eyes:     General: No scleral icterus.       Right eye: No discharge.        Left eye: No discharge.     Conjunctiva/sclera: Conjunctivae normal.  Cardiovascular:     Rate and Rhythm: Normal rate and regular rhythm.     Heart sounds: Normal heart sounds.  Pulmonary:     Effort: Pulmonary effort is normal. No respiratory distress.     Breath sounds: Normal breath sounds. No wheezing, rhonchi or rales.  Abdominal:     Tenderness: There is no right CVA tenderness or left CVA tenderness.  Musculoskeletal:        General: Deformity (congenital abnormality to bilateral upper extremities) present.     Cervical back: Neck supple.     Lumbar back: Tenderness (right thoracolumbar paraspinal muscle tenderness and upper lumbar muscle tenderness) present. No swelling, edema, signs of trauma, spasms or bony tenderness. Normal range of motion. Negative right straight leg raise test and negative left straight leg raise test.       Back:     Comments: FROM noted, however pt most comfortable leaning forward at 90 degree angle Reproducible tenderness to knotted, tense muscle along the paraspinal region on R   Skin:    General: Skin is dry.  Neurological:     General: No focal deficit present.     Mental Status: She is alert. Mental status is at baseline.     Motor: No weakness.     Gait: Gait normal.  Psychiatric:        Mood and Affect: Mood normal.        Behavior: Behavior normal.        Thought Content: Thought content normal.  UC Treatments / Results  Labs (all labs ordered are listed, but only abnormal results are displayed) Labs Reviewed  URINALYSIS, ROUTINE W REFLEX MICROSCOPIC     EKG   Radiology No results found.  Procedures Procedures (including critical care time)  Medications Ordered in UC Medications - No data to display  Initial Impression / Assessment and Plan / UC Course  I have reviewed the triage vital signs and the nursing notes.  Pertinent labs & imaging results that were available during my care of the patient were reviewed by me and considered in my medical decision making (see chart for details).     Myofascial low back pain -UA obtained to ensure this was not kidney or urinary related as patient had no resolution to prescribed therapy several days prior.  We did discuss possible dry needling versus trigger point injection.  Patient wanted to proceed without any procedures today and try stronger p.o. medications.  I also recommended moist heat and massage to the area.  Final Clinical Impressions(s) / UC Diagnoses   Final diagnoses:  Myofascial low back pain     Discharge Instructions      Your urine is normal. You have a tender knot in your muscles. Please purchase a microwavable heating pack, apply moist heat to your back and the affected area 3 times daily followed by a massage. Please stop the medications prescribed last visit and start taking the ones prescribed today. Take the anti-inflammatory diclofenac twice daily with food. Start taking the muscle relaxer three times daily as needed, use with caution as it may make you drowsy. Follow-up within 1 week if symptoms persist for possible dry needling or trigger point injection.     ED Prescriptions     Medication Sig Dispense Auth. Provider   carisoprodol (SOMA) 350 MG tablet  (Status: Discontinued) Take 1 tablet (350 mg total) by mouth 3 (three) times daily. 15 tablet Ramesses Crampton L, PA   diclofenac (VOLTAREN) 75 MG EC tablet Take 1 tablet (75 mg total) by mouth 2 (two) times daily with a meal for 7 days. 14 tablet Cloie Wooden L, PA   carisoprodol (SOMA) 350 MG  tablet Take 1 tablet (350 mg total) by mouth 3 (three) times daily. 15 tablet Juleah Paradise L, [place]      I have reviewed the PDMP during this encounter.   Maretta Bees, [place] 07/01/22 2028

## 2022-07-01 NOTE — Discharge Instructions (Addendum)
Your urine is normal. You have a tender knot in your muscles. Please purchase a microwavable heating pack, apply moist heat to your back and the affected area 3 times daily followed by a massage. Please stop the medications prescribed last visit and start taking the ones prescribed today. Take the anti-inflammatory diclofenac twice daily with food. Start taking the muscle relaxer three times daily as needed, use with caution as it may make you drowsy. Follow-up within 1 week if symptoms persist for possible dry needling or trigger point injection.

## 2022-07-01 NOTE — ED Triage Notes (Signed)
Patient was here Tuesday for pain in her lower right back/side. Patient reports that when she was here she was given some medication but it has not helped.

## 2022-07-08 ENCOUNTER — Other Ambulatory Visit: Payer: Self-pay

## 2022-07-08 ENCOUNTER — Emergency Department
Admission: EM | Admit: 2022-07-08 | Discharge: 2022-07-08 | Discharge status: Against Medical Advice | Payer: Medicare Other | Source: Home / Self Care

## 2022-07-08 ENCOUNTER — Emergency Department: Payer: Medicare Other

## 2022-07-08 DIAGNOSIS — J45909 Unspecified asthma, uncomplicated: Secondary | ICD-10-CM | POA: Insufficient documentation

## 2022-07-08 DIAGNOSIS — Z5321 Procedure and treatment not carried out due to patient leaving prior to being seen by health care provider: Secondary | ICD-10-CM | POA: Insufficient documentation

## 2022-07-08 DIAGNOSIS — J9601 Acute respiratory failure with hypoxia: Secondary | ICD-10-CM | POA: Diagnosis not present

## 2022-07-08 DIAGNOSIS — J069 Acute upper respiratory infection, unspecified: Secondary | ICD-10-CM | POA: Insufficient documentation

## 2022-07-08 DIAGNOSIS — Z20822 Contact with and (suspected) exposure to covid-19: Secondary | ICD-10-CM | POA: Insufficient documentation

## 2022-07-08 DIAGNOSIS — M549 Dorsalgia, unspecified: Secondary | ICD-10-CM | POA: Insufficient documentation

## 2022-07-08 DIAGNOSIS — R0602 Shortness of breath: Secondary | ICD-10-CM | POA: Diagnosis not present

## 2022-07-08 LAB — SARS CORONAVIRUS 2 BY RT PCR: SARS Coronavirus 2 by RT PCR: NEGATIVE

## 2022-07-08 MED ORDER — ACETAMINOPHEN 325 MG PO TABS
650.0000 mg | ORAL_TABLET | Freq: Once | ORAL | Status: AC
  Administered 2022-07-08: 650 mg via ORAL
  Filled 2022-07-08: qty 2

## 2022-07-08 MED ORDER — IPRATROPIUM-ALBUTEROL 0.5-2.5 (3) MG/3ML IN SOLN
3.0000 mL | Freq: Once | RESPIRATORY_TRACT | Status: AC
  Administered 2022-07-08: 3 mL via RESPIRATORY_TRACT
  Filled 2022-07-08: qty 3

## 2022-07-08 NOTE — ED Notes (Signed)
Patient requesting an albuterol nebulizer.  States "I can't breath".  Voice clear and strong.  Reviewed that VS were wnl.  Offered to have PA re-evaluate patient.  Patient stated "Well I told them I could not breathe why didn't they order it for me?"  Again offered to have PA re-evaluate her for the need for albuterol, patient stated that she would just "be in the waiting room and if she passed out I would know why."

## 2022-07-08 NOTE — ED Provider Triage Note (Signed)
Emergency Medicine Provider Triage Evaluation Note  Hokulani Rogel , a 36 y.o. female  was evaluated in triage.  Pt complains of SOB and fever. Patient reports worse since URI began 2 days ago. Refusing all bloodwork  Review of Systems  Positive: SOB Negative: CP  Physical Exam  BP 115/73   Pulse (!) 110   Temp (!) 101.7 F (38.7 C) (Oral)   Resp 18   LMP 06/12/2022 (Approximate)   SpO2 94%  Gen:   Awake, no distress   Resp:  Normal effort, speaking in complete sentences  MSK:   Moves extremities without difficulty  Other:    Medical Decision Making  Medically screening exam initiated at 4:24 PM.  Appropriate orders placed.  Marlise Fahr was informed that the remainder of the evaluation will be completed by another provider, this initial triage assessment does not replace that evaluation, and the importance of remaining in the ED until their evaluation is complete.     Jackelyn Hoehn, PA-C 07/08/22 1631

## 2022-07-08 NOTE — ED Notes (Signed)
Pt states she cannot stay because she needs to pick up another one of her kids.

## 2022-07-08 NOTE — ED Notes (Signed)
Pt coughing; pt verbal she thinks maybe she has a pulled muscle from coughing.

## 2022-07-08 NOTE — ED Provider Triage Note (Signed)
Emergency Medicine Provider Triage Evaluation Note  Monique Roy , a 36 y.o. female  was evaluated in triage.  Pt complains of right sided back pain x 1 month. Also shortness of breath.  Has been seen at [facility] and wants a third opinion.    Review of Systems  Positive: Short of breath.  + asthma + URI Negative: No n/v  Physical Exam  BP 115/73   Pulse (!) 110   Temp (!) 101.7 F (38.7 C) (Oral)   Resp 18   LMP 06/12/2022 (Approximate)   SpO2 94%  Gen:   Awake, no distress   Resp:  Normal effort  faint wheeze not bilateral bases.  MSK:   Moves extremities without difficulty  Other:  Patient argumentative.  Refuses COVID and influenza testing.  Refuses lab work.  Patient with temp 101.7 but refuses to take medication.  Medical Decision Making  Medically screening exam initiated at 1:21 PM.  Appropriate orders placed.  Monique Roy was informed that the remainder of the evaluation will be completed by another provider, this initial triage assessment does not replace that evaluation, and the importance of remaining in the ED until their evaluation is complete.     Tommi Rumps, PA-C 07/08/22 1344

## 2022-07-08 NOTE — ED Notes (Signed)
Pt refusing labs when lab arrived, states "I know my body I'm dehydrated"

## 2022-07-08 NOTE — ED Notes (Signed)
Pt left AMA to pick up her child.

## 2022-07-08 NOTE — ED Notes (Signed)
Pt willing to let this RN attempt for blood-work but also states is hard stick, "dehydrated" and always requires a central line. Assessed and did not stick pt as did not think will have success without US/IV team. Pt states has to leave by 5pm.

## 2022-07-08 NOTE — ED Notes (Addendum)
Called into bathroom by another visitor.  Patient leaning on sink countertop stating "I can't breathe.  I want an albuterol".  Again discussed the option of being re-evaluated by the PA in triage.  Patient again stating that she needs to pick up her children at daycare by 1800.   I again offered her to see the PA.  She asked for someone to just wheel her out into the parking lot, Informed patient that we do not provide that service.  I again offered her to be seen and evaluated by PA.  Patient agreed.  I placed patient in a wheel chair, due to claim that she was unable to ambulate, and brought patient into triage to be re-evaluated by PA.  Albuterol neb ordered and given per [facility]

## 2022-07-08 NOTE — ED Triage Notes (Signed)
Pt to ED for right sided back pain and shob for a month. States went to [facility] but wants a third opinion. States only [facility] when has a cold and has a cold right now.

## 2022-07-08 NOTE — ED Notes (Signed)
Lab called to collect labs d/t pt having limited areas to stick

## 2022-07-09 ENCOUNTER — Other Ambulatory Visit: Payer: Self-pay

## 2022-07-09 ENCOUNTER — Inpatient Hospital Stay: Payer: Medicare Other

## 2022-07-09 ENCOUNTER — Emergency Department: Payer: Medicare Other

## 2022-07-09 ENCOUNTER — Encounter: Payer: Self-pay | Admitting: Radiology

## 2022-07-09 ENCOUNTER — Inpatient Hospital Stay
Admission: EM | Admit: 2022-07-09 | Discharge: 2022-07-13 | DRG: 208 | Disposition: A | Discharge status: Home / Self Care | Payer: Medicare Other | Attending: Internal Medicine | Admitting: Internal Medicine

## 2022-07-09 DIAGNOSIS — Z7951 Long term (current) use of inhaled steroids: Secondary | ICD-10-CM

## 2022-07-09 DIAGNOSIS — E663 Overweight: Secondary | ICD-10-CM | POA: Diagnosis present

## 2022-07-09 DIAGNOSIS — J45901 Unspecified asthma with (acute) exacerbation: Secondary | ICD-10-CM | POA: Diagnosis not present

## 2022-07-09 DIAGNOSIS — J4541 Moderate persistent asthma with (acute) exacerbation: Secondary | ICD-10-CM | POA: Diagnosis present

## 2022-07-09 DIAGNOSIS — R0603 Acute respiratory distress: Secondary | ICD-10-CM | POA: Diagnosis not present

## 2022-07-09 DIAGNOSIS — J9600 Acute respiratory failure, unspecified whether with hypoxia or hypercapnia: Secondary | ICD-10-CM

## 2022-07-09 DIAGNOSIS — I119 Hypertensive heart disease without heart failure: Secondary | ICD-10-CM | POA: Diagnosis present

## 2022-07-09 DIAGNOSIS — Z793 Long term (current) use of hormonal contraceptives: Secondary | ICD-10-CM

## 2022-07-09 DIAGNOSIS — T380X5A Adverse effect of glucocorticoids and synthetic analogues, initial encounter: Secondary | ICD-10-CM | POA: Diagnosis present

## 2022-07-09 DIAGNOSIS — R030 Elevated blood-pressure reading, without diagnosis of hypertension: Secondary | ICD-10-CM | POA: Diagnosis not present

## 2022-07-09 DIAGNOSIS — J9602 Acute respiratory failure with hypercapnia: Secondary | ICD-10-CM | POA: Diagnosis present

## 2022-07-09 DIAGNOSIS — R6510 Systemic inflammatory response syndrome (SIRS) of non-infectious origin without acute organ dysfunction: Secondary | ICD-10-CM | POA: Diagnosis present

## 2022-07-09 DIAGNOSIS — Z79899 Other long term (current) drug therapy: Secondary | ICD-10-CM | POA: Diagnosis not present

## 2022-07-09 DIAGNOSIS — Z1152 Encounter for screening for COVID-19: Secondary | ICD-10-CM

## 2022-07-09 DIAGNOSIS — Q899 Congenital malformation, unspecified: Secondary | ICD-10-CM

## 2022-07-09 DIAGNOSIS — J101 Influenza due to other identified influenza virus with other respiratory manifestations: Secondary | ICD-10-CM | POA: Diagnosis present

## 2022-07-09 DIAGNOSIS — F419 Anxiety disorder, unspecified: Secondary | ICD-10-CM | POA: Diagnosis present

## 2022-07-09 DIAGNOSIS — J40 Bronchitis, not specified as acute or chronic: Secondary | ICD-10-CM | POA: Diagnosis present

## 2022-07-09 DIAGNOSIS — R319 Hematuria, unspecified: Secondary | ICD-10-CM | POA: Diagnosis not present

## 2022-07-09 DIAGNOSIS — Q740 Other congenital malformations of upper limb(s), including shoulder girdle: Secondary | ICD-10-CM

## 2022-07-09 DIAGNOSIS — J9601 Acute respiratory failure with hypoxia: Principal | ICD-10-CM | POA: Diagnosis present

## 2022-07-09 DIAGNOSIS — R0602 Shortness of breath: Secondary | ICD-10-CM | POA: Diagnosis present

## 2022-07-09 DIAGNOSIS — I2489 Other forms of acute ischemic heart disease: Secondary | ICD-10-CM | POA: Diagnosis present

## 2022-07-09 DIAGNOSIS — Z6828 Body mass index (BMI) 28.0-28.9, adult: Secondary | ICD-10-CM | POA: Diagnosis not present

## 2022-07-09 DIAGNOSIS — I10 Essential (primary) hypertension: Secondary | ICD-10-CM

## 2022-07-09 DIAGNOSIS — Z5320 Procedure and treatment not carried out because of patient's decision for unspecified reasons: Secondary | ICD-10-CM | POA: Diagnosis present

## 2022-07-09 LAB — BLOOD GAS, ARTERIAL
Acid-Base Excess: 1.2 mmol/L (ref 0.0–2.0)
Bicarbonate: 29 mmol/L — ABNORMAL HIGH (ref 20.0–28.0)
FIO2: 40 %
MECHVT: 500 mL
Mechanical Rate: 14
O2 Saturation: 100 %
PEEP: 5 cmH2O
Patient temperature: 37
pCO2 arterial: 59 mmHg — ABNORMAL HIGH (ref 32–48)
pH, Arterial: 7.3 — ABNORMAL LOW (ref 7.35–7.45)
pO2, Arterial: 145 mmHg — ABNORMAL HIGH (ref 83–108)

## 2022-07-09 LAB — MAGNESIUM: Magnesium: 2.8 mg/dL — ABNORMAL HIGH (ref 1.7–2.4)

## 2022-07-09 LAB — URINALYSIS, ROUTINE W REFLEX MICROSCOPIC
Bilirubin Urine: NEGATIVE
Glucose, UA: 150 mg/dL — AB
Ketones, ur: NEGATIVE mg/dL
Leukocytes,Ua: NEGATIVE
Nitrite: NEGATIVE
Protein, ur: 100 mg/dL — AB
RBC / HPF: >50 RBC/hpf — ABNORMAL HIGH (ref 0–5)
Specific Gravity, Urine: 1.023 (ref 1.005–1.030)
Squamous Epithelial / HPF: NONE SEEN (ref 0–5)
pH: 5 (ref 5.0–8.0)

## 2022-07-09 LAB — CBC WITH DIFFERENTIAL/PLATELET
Abs Immature Granulocytes: 0.23 10*3/uL — ABNORMAL HIGH (ref 0.00–0.07)
Basophils Absolute: 0.1 10*3/uL (ref 0.0–0.1)
Basophils Relative: 0 %
Eosinophils Absolute: 0 10*3/uL (ref 0.0–0.5)
Eosinophils Relative: 0 %
HCT: 45.6 % (ref 36.0–46.0)
Hemoglobin: 14.3 g/dL (ref 12.0–15.0)
Immature Granulocytes: 1 %
Lymphocytes Relative: 6 %
Lymphs Abs: 1.4 10*3/uL (ref 0.7–4.0)
MCH: 28.5 pg (ref 26.0–34.0)
MCHC: 31.4 g/dL (ref 30.0–36.0)
MCV: 91 fL (ref 80.0–100.0)
Monocytes Absolute: 1 10*3/uL (ref 0.1–1.0)
Monocytes Relative: 4 %
Neutro Abs: 22.8 10*3/uL — ABNORMAL HIGH (ref 1.7–7.7)
Neutrophils Relative %: 89 %
Platelets: 354 10*3/uL (ref 150–400)
RBC: 5.01 MIL/uL (ref 3.87–5.11)
RDW: 13.8 % (ref 11.5–15.5)
Smear Review: NORMAL
WBC: 25.5 10*3/uL — ABNORMAL HIGH (ref 4.0–10.5)
nRBC: 0 % (ref 0.0–0.2)

## 2022-07-09 LAB — COMPREHENSIVE METABOLIC PANEL
ALT: 29 U/L (ref 0–44)
AST: 38 U/L (ref 15–41)
Albumin: 4.1 g/dL (ref 3.5–5.0)
Alkaline Phosphatase: 55 U/L (ref 38–126)
Anion gap: 10 (ref 5–15)
BUN: 8 mg/dL (ref 6–20)
CO2: 26 mmol/L (ref 22–32)
Calcium: 9.1 mg/dL (ref 8.9–10.3)
Chloride: 106 mmol/L (ref 98–111)
Creatinine, Ser: 0.8 mg/dL (ref 0.44–1.00)
GFR, Estimated: >60 mL/min (ref >60)
Glucose, Bld: 180 mg/dL — ABNORMAL HIGH (ref 70–99)
Potassium: 3.5 mmol/L (ref 3.5–5.1)
Sodium: 142 mmol/L (ref 135–145)
Total Bilirubin: 0.7 mg/dL (ref 0.3–1.2)
Total Protein: 8.3 g/dL — ABNORMAL HIGH (ref 6.5–8.1)

## 2022-07-09 LAB — URINE DRUG SCREEN, QUALITATIVE (ARMC ONLY)
Amphetamines, Ur Screen: NOT DETECTED
Barbiturates, Ur Screen: NOT DETECTED
Benzodiazepine, Ur Scrn: POSITIVE — AB
Cannabinoid 50 Ng, Ur ~~LOC~~: POSITIVE — AB
Cocaine Metabolite,Ur ~~LOC~~: NOT DETECTED
MDMA (Ecstasy)Ur Screen: NOT DETECTED
Methadone Scn, Ur: NOT DETECTED
Opiate, Ur Screen: NOT DETECTED
Phencyclidine (PCP) Ur S: NOT DETECTED
Tricyclic, Ur Screen: NOT DETECTED

## 2022-07-09 LAB — PROCALCITONIN: Procalcitonin: 0.48 ng/mL

## 2022-07-09 LAB — GLUCOSE, CAPILLARY
Glucose-Capillary: 132 mg/dL — ABNORMAL HIGH (ref 70–99)
Glucose-Capillary: 135 mg/dL — ABNORMAL HIGH (ref 70–99)
Glucose-Capillary: 136 mg/dL — ABNORMAL HIGH (ref 70–99)
Glucose-Capillary: 149 mg/dL — ABNORMAL HIGH (ref 70–99)
Glucose-Capillary: 163 mg/dL — ABNORMAL HIGH (ref 70–99)
Glucose-Capillary: 177 mg/dL — ABNORMAL HIGH (ref 70–99)

## 2022-07-09 LAB — TROPONIN I (HIGH SENSITIVITY)
Troponin I (High Sensitivity): 22 ng/L — ABNORMAL HIGH (ref <18)
Troponin I (High Sensitivity): 29 ng/L — ABNORMAL HIGH (ref <18)
Troponin I (High Sensitivity): 39 ng/L — ABNORMAL HIGH (ref <18)
Troponin I (High Sensitivity): 24 ng/L — ABNORMAL HIGH (ref <18)

## 2022-07-09 LAB — RESP PANEL BY RT-PCR (FLU A&B, COVID) ARPGX2
Influenza A by PCR: POSITIVE — AB
Influenza B by PCR: NEGATIVE
SARS Coronavirus 2 by RT PCR: NEGATIVE

## 2022-07-09 LAB — MRSA NEXT GEN BY PCR, NASAL: MRSA by PCR Next Gen: NOT DETECTED

## 2022-07-09 LAB — PREGNANCY, URINE: Preg Test, Ur: NEGATIVE

## 2022-07-09 LAB — BRAIN NATRIURETIC PEPTIDE: B Natriuretic Peptide: 130.6 pg/mL — ABNORMAL HIGH (ref 0.0–100.0)

## 2022-07-09 LAB — D-DIMER, QUANTITATIVE: D-Dimer, Quant: 1.15 ug/mL-FEU — ABNORMAL HIGH (ref 0.00–0.50)

## 2022-07-09 LAB — LACTIC ACID, PLASMA: Lactic Acid, Venous: 1.4 mmol/L (ref 0.5–1.9)

## 2022-07-09 MED ORDER — VECURONIUM BROMIDE 10 MG IV SOLR: 10.0000 mg | INTRAVENOUS | Status: AC

## 2022-07-09 MED ORDER — METOPROLOL TARTRATE 5 MG/5ML IV SOLN
INTRAVENOUS | Status: AC
  Administered 2022-07-09: 2.5 mg via INTRAVENOUS
  Filled 2022-07-09: qty 5

## 2022-07-09 MED ORDER — FENTANYL BOLUS VIA INFUSION
50.0000 ug | INTRAVENOUS | Status: DC | PRN
  Administered 2022-07-10 – 2022-07-11 (×2): 100 ug via INTRAVENOUS

## 2022-07-09 MED ORDER — VECURONIUM BROMIDE 10 MG IV SOLR
10.0000 mg | INTRAVENOUS | Status: DC | PRN
  Administered 2022-07-09 – 2022-07-10 (×5): 10 mg via INTRAVENOUS
  Filled 2022-07-09 (×5): qty 10

## 2022-07-09 MED ORDER — FENTANYL CITRATE (PF) 100 MCG/2ML IJ SOLN
INTRAMUSCULAR | Status: AC
  Administered 2022-07-09: 100 ug
  Filled 2022-07-09: qty 2

## 2022-07-09 MED ORDER — MIDAZOLAM HCL 2 MG/2ML IJ SOLN
1.0000 mg | INTRAMUSCULAR | Status: DC | PRN
  Administered 2022-07-10: 2 mg via INTRAVENOUS
  Filled 2022-07-09: qty 2

## 2022-07-09 MED ORDER — PROPOFOL 10 MG/ML IV BOLUS
INTRAVENOUS | Status: AC
  Administered 2022-07-09: 100 mg
  Filled 2022-07-09: qty 20

## 2022-07-09 MED ORDER — SODIUM CHLORIDE 0.9 % IV SOLN
500.0000 mg | Freq: Once | INTRAVENOUS | Status: AC
  Administered 2022-07-09: 500 mg via INTRAVENOUS
  Filled 2022-07-09: qty 5

## 2022-07-09 MED ORDER — METOPROLOL TARTRATE 5 MG/5ML IV SOLN: 2.5000 mg | Freq: Once | INTRAVENOUS | Status: AC

## 2022-07-09 MED ORDER — DOCUSATE SODIUM 100 MG PO CAPS: 100.0000 mg | ORAL_CAPSULE | Freq: Two times a day (BID) | ORAL | Status: DC | PRN

## 2022-07-09 MED ORDER — TRAZODONE HCL 50 MG PO TABS: 25.0000 mg | ORAL_TABLET | Freq: Every evening | ORAL | Status: DC | PRN

## 2022-07-09 MED ORDER — SODIUM CHLORIDE 0.9 % IV SOLN: INTRAVENOUS | Status: DC

## 2022-07-09 MED ORDER — FENTANYL CITRATE (PF) 100 MCG/2ML IJ SOLN: 100.0000 ug | Freq: Once | INTRAMUSCULAR | Status: AC

## 2022-07-09 MED ORDER — IPRATROPIUM-ALBUTEROL 0.5-2.5 (3) MG/3ML IN SOLN: 3.0000 mL | Freq: Four times a day (QID) | RESPIRATORY_TRACT | Status: DC

## 2022-07-09 MED ORDER — POLYETHYLENE GLYCOL 3350 17 G PO PACK
17.0000 g | PACK | Freq: Every day | ORAL | Status: DC
  Administered 2022-07-10: 17 g
  Filled 2022-07-09: qty 1

## 2022-07-09 MED ORDER — SODIUM CHLORIDE 0.9 % IV SOLN
1.0000 g | Freq: Once | INTRAVENOUS | Status: AC
  Administered 2022-07-09: 1 g via INTRAVENOUS
  Filled 2022-07-09: qty 10

## 2022-07-09 MED ORDER — ACETAMINOPHEN 325 MG RE SUPP: 650.0000 mg | Freq: Four times a day (QID) | RECTAL | Status: DC | PRN

## 2022-07-09 MED ORDER — PROPOFOL 1000 MG/100ML IV EMUL: 0.0000 ug/kg/min | INTRAVENOUS | Status: DC

## 2022-07-09 MED ORDER — ETOMIDATE 2 MG/ML IV SOLN
INTRAVENOUS | Status: AC
  Administered 2022-07-09: 20 mg via INTRAVENOUS
  Filled 2022-07-09: qty 10

## 2022-07-09 MED ORDER — VECURONIUM BROMIDE 10 MG IV SOLR
10.0000 mg | Freq: Once | INTRAVENOUS | Status: AC
  Administered 2022-07-09: 10 mg via INTRAVENOUS

## 2022-07-09 MED ORDER — IPRATROPIUM-ALBUTEROL 0.5-2.5 (3) MG/3ML IN SOLN
3.0000 mL | RESPIRATORY_TRACT | Status: DC
  Administered 2022-07-09 – 2022-07-11 (×12): 3 mL via RESPIRATORY_TRACT
  Filled 2022-07-09 (×11): qty 3

## 2022-07-09 MED ORDER — HYDRALAZINE HCL 20 MG/ML IJ SOLN
10.0000 mg | Freq: Once | INTRAMUSCULAR | Status: AC
  Administered 2022-07-09: 10 mg via INTRAVENOUS
  Filled 2022-07-09: qty 1

## 2022-07-09 MED ORDER — METHYLPREDNISOLONE SODIUM SUCC 40 MG IJ SOLR: 40.0000 mg | Freq: Two times a day (BID) | INTRAMUSCULAR | Status: DC

## 2022-07-09 MED ORDER — ETOMIDATE 2 MG/ML IV SOLN: 20.0000 mg | Freq: Once | INTRAVENOUS | Status: AC

## 2022-07-09 MED ORDER — BUDESONIDE 0.5 MG/2ML IN SUSP
0.5000 mg | Freq: Two times a day (BID) | RESPIRATORY_TRACT | Status: DC
  Administered 2022-07-09 – 2022-07-13 (×9): 0.5 mg via RESPIRATORY_TRACT
  Filled 2022-07-09 (×9): qty 2

## 2022-07-09 MED ORDER — FENTANYL 2500MCG IN NS 250ML (10MCG/ML) PREMIX INFUSION
50.0000 ug/h | INTRAVENOUS | Status: DC
  Administered 2022-07-09: 100 ug/h via INTRAVENOUS
  Administered 2022-07-09: 50 ug/h via INTRAVENOUS
  Administered 2022-07-10 (×2): 200 ug/h via INTRAVENOUS
  Filled 2022-07-09 (×4): qty 250

## 2022-07-09 MED ORDER — ENOXAPARIN SODIUM 40 MG/0.4ML IJ SOSY: 40.0000 mg | PREFILLED_SYRINGE | INTRAMUSCULAR | Status: DC

## 2022-07-09 MED ORDER — MAGNESIUM HYDROXIDE 400 MG/5ML PO SUSP: 30.0000 mL | Freq: Every day | ORAL | Status: DC | PRN

## 2022-07-09 MED ORDER — FAMOTIDINE 20 MG PO TABS
20.0000 mg | ORAL_TABLET | Freq: Two times a day (BID) | ORAL | Status: DC
  Administered 2022-07-09 – 2022-07-11 (×6): 20 mg
  Filled 2022-07-09 (×6): qty 1

## 2022-07-09 MED ORDER — ORAL CARE MOUTH RINSE
15.0000 mL | OROMUCOSAL | Status: DC
  Administered 2022-07-09 – 2022-07-13 (×24): 15 mL via OROMUCOSAL

## 2022-07-09 MED ORDER — ALBUTEROL SULFATE (2.5 MG/3ML) 0.083% IN NEBU
2.5000 mg | INHALATION_SOLUTION | RESPIRATORY_TRACT | Status: DC
  Administered 2022-07-09: 2.5 mg via RESPIRATORY_TRACT

## 2022-07-09 MED ORDER — LORAZEPAM 2 MG/ML IJ SOLN
0.5000 mg | Freq: Once | INTRAMUSCULAR | Status: AC
  Administered 2022-07-09: 0.5 mg via INTRAVENOUS
  Filled 2022-07-09: qty 1

## 2022-07-09 MED ORDER — ONDANSETRON HCL 4 MG PO TABS: 4.0000 mg | ORAL_TABLET | Freq: Four times a day (QID) | ORAL | Status: DC | PRN

## 2022-07-09 MED ORDER — DOCUSATE SODIUM 50 MG/5ML PO LIQD
100.0000 mg | Freq: Two times a day (BID) | ORAL | Status: DC
  Administered 2022-07-09 – 2022-07-11 (×5): 100 mg
  Filled 2022-07-09 (×6): qty 10

## 2022-07-09 MED ORDER — SUCCINYLCHOLINE CHLORIDE 200 MG/10ML IV SOSY
PREFILLED_SYRINGE | INTRAVENOUS | Status: AC
  Administered 2022-07-09: 200 mg via INTRAVENOUS
  Filled 2022-07-09: qty 10

## 2022-07-09 MED ORDER — MAGNESIUM SULFATE 2 GM/50ML IV SOLN
2.0000 g | Freq: Once | INTRAVENOUS | Status: AC
  Administered 2022-07-09: 2 g via INTRAVENOUS
  Filled 2022-07-09: qty 50

## 2022-07-09 MED ORDER — FENTANYL CITRATE PF 50 MCG/ML IJ SOSY
50.0000 ug | PREFILLED_SYRINGE | Freq: Once | INTRAMUSCULAR | Status: AC
  Administered 2022-07-09: 50 ug via INTRAVENOUS
  Filled 2022-07-09: qty 1

## 2022-07-09 MED ORDER — POLYETHYLENE GLYCOL 3350 17 G PO PACK: 17.0000 g | PACK | Freq: Every day | ORAL | Status: DC | PRN

## 2022-07-09 MED ORDER — CARISOPRODOL 350 MG PO TABS: 350.0000 mg | ORAL_TABLET | Freq: Three times a day (TID) | ORAL | Status: DC

## 2022-07-09 MED ORDER — ALBUTEROL SULFATE (2.5 MG/3ML) 0.083% IN NEBU
INHALATION_SOLUTION | RESPIRATORY_TRACT | Status: AC
  Filled 2022-07-09: qty 3

## 2022-07-09 MED ORDER — SODIUM CHLORIDE 0.9 % IV SOLN: 1.0000 g | INTRAVENOUS | Status: DC

## 2022-07-09 MED ORDER — LORAZEPAM 2 MG/ML IJ SOLN
1.0000 mg | Freq: Once | INTRAMUSCULAR | Status: AC
  Administered 2022-07-09: 1 mg via INTRAVENOUS
  Filled 2022-07-09: qty 1

## 2022-07-09 MED ORDER — PREDNISONE 20 MG PO TABS: 40.0000 mg | ORAL_TABLET | Freq: Every day | ORAL | Status: DC

## 2022-07-09 MED ORDER — METHYLPREDNISOLONE SODIUM SUCC 40 MG IJ SOLR
40.0000 mg | Freq: Two times a day (BID) | INTRAMUSCULAR | Status: DC
  Administered 2022-07-09 – 2022-07-11 (×5): 40 mg via INTRAVENOUS
  Filled 2022-07-09 (×5): qty 1

## 2022-07-09 MED ORDER — INSULIN ASPART 100 UNIT/ML IJ SOLN
0.0000 [IU] | INTRAMUSCULAR | Status: DC
  Administered 2022-07-09 (×4): 2 [IU] via SUBCUTANEOUS
  Administered 2022-07-10 (×2): 3 [IU] via SUBCUTANEOUS
  Administered 2022-07-10 (×2): 2 [IU] via SUBCUTANEOUS
  Administered 2022-07-10 – 2022-07-11 (×3): 3 [IU] via SUBCUTANEOUS
  Administered 2022-07-11: 5 [IU] via SUBCUTANEOUS
  Administered 2022-07-11: 2 [IU] via SUBCUTANEOUS
  Filled 2022-07-09 (×13): qty 1

## 2022-07-09 MED ORDER — FREE WATER
50.0000 mL | Status: DC
  Administered 2022-07-09 – 2022-07-11 (×12): 50 mL

## 2022-07-09 MED ORDER — VECURONIUM BROMIDE 10 MG IV SOLR
INTRAVENOUS | Status: AC
  Administered 2022-07-09: 10 mg via INTRAVENOUS
  Filled 2022-07-09: qty 10

## 2022-07-09 MED ORDER — SODIUM CHLORIDE 0.9 % IV BOLUS
500.0000 mL | Freq: Once | INTRAVENOUS | Status: AC
  Administered 2022-07-09: 500 mL via INTRAVENOUS

## 2022-07-09 MED ORDER — SUCCINYLCHOLINE CHLORIDE 200 MG/10ML IV SOSY: 200.0000 mg | PREFILLED_SYRINGE | Freq: Once | INTRAVENOUS | Status: AC

## 2022-07-09 MED ORDER — CHLORHEXIDINE GLUCONATE CLOTH 2 % EX PADS
6.0000 | MEDICATED_PAD | Freq: Every day | CUTANEOUS | Status: DC
  Administered 2022-07-09 – 2022-07-11 (×3): 6 via TOPICAL

## 2022-07-09 MED ORDER — ORAL CARE MOUTH RINSE: 15.0000 mL | OROMUCOSAL | Status: DC | PRN

## 2022-07-09 MED ORDER — ACETAMINOPHEN 325 MG PO TABS: 650.0000 mg | ORAL_TABLET | Freq: Four times a day (QID) | ORAL | Status: DC | PRN

## 2022-07-09 MED ORDER — OSELTAMIVIR PHOSPHATE 75 MG PO CAPS
75.0000 mg | ORAL_CAPSULE | Freq: Once | ORAL | Status: DC
  Filled 2022-07-09: qty 1

## 2022-07-09 MED ORDER — MIDAZOLAM HCL 2 MG/2ML IJ SOLN
4.0000 mg | Freq: Once | INTRAMUSCULAR | Status: AC
  Administered 2022-07-09: 4 mg via INTRAVENOUS

## 2022-07-09 MED ORDER — ONDANSETRON HCL 4 MG/2ML IJ SOLN: 4.0000 mg | Freq: Four times a day (QID) | INTRAMUSCULAR | Status: DC | PRN

## 2022-07-09 MED ORDER — PROPOFOL 1000 MG/100ML IV EMUL
INTRAVENOUS | Status: AC
  Filled 2022-07-09: qty 100

## 2022-07-09 MED ORDER — FLUTICASONE PROPIONATE HFA 110 MCG/ACT IN AERO: 2.0000 | INHALATION_SPRAY | Freq: Two times a day (BID) | RESPIRATORY_TRACT | Status: DC

## 2022-07-09 MED ORDER — FENTANYL CITRATE PF 50 MCG/ML IJ SOSY: 50.0000 ug | PREFILLED_SYRINGE | INTRAMUSCULAR | Status: DC | PRN

## 2022-07-09 MED ORDER — PROPOFOL 1000 MG/100ML IV EMUL: 5.0000 ug/kg/min | INTRAVENOUS | Status: DC

## 2022-07-09 MED ORDER — NORGESTIM-ETH ESTRAD TRIPHASIC 0.18/0.215/0.25 MG-25 MCG PO TABS: 1.0000 | ORAL_TABLET | Freq: Every day | ORAL | Status: DC

## 2022-07-09 MED ORDER — MIDAZOLAM HCL 2 MG/2ML IJ SOLN
INTRAMUSCULAR | Status: AC
  Administered 2022-07-09: 4 mg via INTRAVENOUS
  Filled 2022-07-09: qty 4

## 2022-07-09 MED ORDER — SODIUM CHLORIDE 0.9 % IV SOLN
500.0000 mg | INTRAVENOUS | Status: DC
  Administered 2022-07-10 – 2022-07-12 (×3): 500 mg via INTRAVENOUS
  Filled 2022-07-09: qty 5
  Filled 2022-07-09 (×2): qty 500
  Filled 2022-07-09: qty 5

## 2022-07-09 MED ORDER — OSELTAMIVIR PHOSPHATE 6 MG/ML PO SUSR
75.0000 mg | Freq: Two times a day (BID) | ORAL | Status: DC
  Administered 2022-07-09 – 2022-07-12 (×7): 75 mg
  Filled 2022-07-09 (×8): qty 12.5

## 2022-07-09 MED ORDER — MIDAZOLAM-SODIUM CHLORIDE 100-0.9 MG/100ML-% IV SOLN
0.5000 mg/h | INTRAVENOUS | Status: DC
  Administered 2022-07-09: 9 mg/h via INTRAVENOUS
  Administered 2022-07-09: 0.5 mg/h via INTRAVENOUS
  Administered 2022-07-10: 9 mg/h via INTRAVENOUS
  Filled 2022-07-09 (×3): qty 100

## 2022-07-09 MED ORDER — SODIUM CHLORIDE 0.9 % IV SOLN
1.0000 g | INTRAVENOUS | Status: DC
  Administered 2022-07-10 – 2022-07-12 (×3): 1 g via INTRAVENOUS
  Filled 2022-07-09: qty 10
  Filled 2022-07-09 (×2): qty 1
  Filled 2022-07-09: qty 10

## 2022-07-09 MED ORDER — IPRATROPIUM-ALBUTEROL 0.5-2.5 (3) MG/3ML IN SOLN
3.0000 mL | Freq: Once | RESPIRATORY_TRACT | Status: AC
  Administered 2022-07-09: 3 mL via RESPIRATORY_TRACT
  Filled 2022-07-09: qty 3

## 2022-07-09 MED ORDER — VITAL AF 1.2 CAL PO LIQD
1000.0000 mL | ORAL | Status: DC
  Administered 2022-07-09 – 2022-07-10 (×2): 1000 mL

## 2022-07-09 MED ORDER — ENOXAPARIN SODIUM 40 MG/0.4ML IJ SOSY
40.0000 mg | PREFILLED_SYRINGE | INTRAMUSCULAR | Status: DC
  Administered 2022-07-09 – 2022-07-12 (×4): 40 mg via SUBCUTANEOUS
  Filled 2022-07-09 (×4): qty 0.4

## 2022-07-09 NOTE — ED Notes (Signed)
Report given to Marisue Ivan RN of ICU at this time. No question comments or concerns after report was given at this time.

## 2022-07-09 NOTE — ED Notes (Signed)
Called RT to request assistance at bedside, pt is not handling the Bi-pap well, pt does not like the mask as it feels she is suffocating, pt is intermittently trying to reposition her mask and reposition herself on stretcher to help ease her breathing. Requesting a hi-flo venti mask to see if that can assist pt's breathing with more comfort while sustaining adequate breathing and oxygenation.

## 2022-07-09 NOTE — H&P (Signed)
NAME:  Monique Roy, MRN:  [id number], DOB:  1986/06/22, LOS: 0 ADMISSION DATE:  07/09/2022, CONSULTATION DATE:  07/09/22 REFERRING MD:  Dr. Beather Arbour, CHIEF COMPLAINT:     History of Present Illness:  36 yo F presenting to [facility] ED from home via EMS, after leaving AMA from the ED on 07/08/22, for evaluation of shortness of breath & cough.  EMS reported hypoxia upon arrival with SpO2 in the 80's on RA. She was placed on a NRB, Duoneb/albuterol x 2 & 125 solumedrol IM.  ED course: Upon arrival patient alert, tachycardic, hypertensive, tachypneic, wheezing with SpO2 in the 50's on RA, patient transitioned to BIPAP support. Unfortunately, she did not tolerate BIPAP due to anxiety. When placed on [place] she became hypoxic in the low 80's. She was administered ativan to help tolerate the BIPAP. Over time the patient became more agitated and still in distress, the decision was made to emergently intubate the patient and provide mechanical ventilatory support. She met SIRS criteria and empiric antibiotic coverage ordered. Labs significant for + Influenza A, leukocytosis, elevated PCT, mildly elevated BNP, mildly elevated Troponin, mild hyperglycemia. CXR clear on imaging, UA pending. Medications given: Rocephin & Azithromycin, ativan, etomidate & succinylcholine, NS 500 mL Bolus, Mag 2 g, duo neb, hydralazine Initial Vitals: 98.8, 25, 110, 200/117 & 99% on BIPAP Significant labs: (Labs/ Imaging personally reviewed) I, Domingo Pulse [place], AGACNP-BC, personally viewed and interpreted this ECG. EKG Interpretation: Date: 07/09/22, EKG Time: 03:59, Rate: 112, Rhythm: ST, QRS Axis:  normal, Intervals: bi-atrial enlargement, ST/T Wave abnormalities: Inferior ST depression, Narrative Interpretation: ST with inferior ST depression and bi-atrial enlargement Chemistry: Na+: 142, K+: 3.5, BUN/Cr.: 8/0.80, Serum CO2/ AG: 26/10 Hematology: WBC: 25.5, Hgb: 14.3,  Troponin: 22, BNP: 130.6, Lactic/ PCT: 1.4/ 0.48,  COVID-19 &  Influenza A/B: Influenza A +  CXR 07/09/22: no acute cardiopulmonary disease   Pt admitted to ICU acute respiratory failure mechanically intubated.  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  Administered 4 mg iv bolus via pump and 75 mcg fentanyl bolus via pump with some improvement in respiratory status.    Objective   Blood pressure (!) 182/101, pulse (!) 110, temperature 98.8 F (37.1 C), temperature source Axillary, resp. rate (!) 34, height 5' (1.524 m), weight 63.5 kg, last menstrual period 06/12/2022, SpO2 100 %.        Intake/Output Summary (Last 24 hours) at 07/09/2022 0867 Last data filed at 07/09/2022 6195 Gross per 24 hour  Intake 550 ml  Output --  Net 550 ml   Filed Weights   07/09/22 0442  Weight: 63.5 kg    Examination: General: Acute on chronically-ill appearing female, remains in respiratory distress despite mechanical intubation  HENT: Supple, mild JVD present  Lungs: Severely diminished throughout, tachypneic with accessory muscle use  Cardiovascular: Sinus tachycardia, 1+ brachial/1+ distal pulses  Abdomen: +BS x4, obese, soft, non tender, non distended  Extremities: BUE and BLE congenital abnormalities  Neuro: Awake RASS 0, difficult to redirect, PERRL GU: Indwelling catheter draining yellow urine   Resolved Hospital Problem list     Assessment & Plan:  Acute  Hypoxic / Hypercapnic Respiratory Failure secondary to Asthma Exacerbation in the setting of Influenza A infection PMHx: Asthma - Full vent  support for now: vent settings reviewed and established  - Continue ventilator support & lung protective strategies - Wean PEEP & FiO2 as tolerated, maintain SpO2 > 90% - Head of bed elevated 30 degrees, VAP protocol in place - Plateau pressures less than 30 cm H20  - Intermittent chest x-ray & ABG PRN - Daily WUA with SBT as tolerated  - Ensure adequate pulmonary hygiene  - F/u cultures, trend PCT - Start Tamiflu BID for 5 days - Steroids initiated: solu-medrol 40 mg BID  - Albuterol nebs Q 4, bronchodilators PRN - RASS goal -3 for now given vent dyssynchrony and poor respiratory status   - PAD protocol in place: continue Versed and fentanyl gtts to maintain RASS goal   Elevated Troponin secondary to N-STEMI vs demand ischemia HTN - Continuous telemetry monitoring  - Repeat EKG  - Trend troponins until peaked  - Maintain MAP >65  - Echo pending - D-dimer pending   - Urine drug screen pending   Leukocytosis likely secondary to influenza A - Trend WBC and monitor fever curve  - Trend PCT - Will continue empiric ceftriaxone and azithromycin for now pending culture results/sensitivities   Steroid-induced hyperglycemia  - Hemoglobin A1c - CBG q4hrs  - SSI   Best Practice (right click and "Reselect all SmartList Selections" daily)  Diet/type: Will initiate TF's  DVT prophylaxis: LMWH GI prophylaxis: H2B Lines: N/A Foley:  Yes, and it is still needed Code Status:  full code Last date of multidisciplinary goals of care discussion [07/09/22]  Updated pts sister Orson Ape via telephone regarding pts condition and all questions were answered  Labs   CBC: Recent Labs  Lab 07/09/22 0427  WBC 25.5*  NEUTROABS 22.8*  HGB 14.3  HCT 45.6  MCV 91.0  PLT 814    Basic Metabolic Panel: Recent Labs  Lab 07/09/22 0427  NA 142  K 3.5  CL 106  CO2 26  GLUCOSE 180*  BUN 8  CREATININE 0.80  CALCIUM 9.1  MG 2.8*   GFR: Estimated Creatinine Clearance: 81.7 mL/min (by  C-G formula based on SCr of 0.8 mg/dL). Recent Labs  Lab 07/09/22 0427  PROCALCITON 0.48  WBC 25.5*  LATICACIDVEN 1.4    Liver Function Tests: Recent Labs  Lab 07/09/22 0427  AST 38  ALT 29  ALKPHOS 55  BILITOT 0.7  PROT 8.3*  ALBUMIN 4.1   No results for input(s): "LIPASE", "AMYLASE" in the last 168 hours. No results for input(s): "AMMONIA" in the last 168 hours.  ABG No results found for: "PHART", "PCO2ART", "PO2ART", "HCO3", "TCO2", "ACIDBASEDEF", "O2SAT"   Coagulation Profile: No results for input(s): "INR", "PROTIME" in the last 168 hours.  Cardiac Enzymes: No results for input(s): "CKTOTAL", "CKMB", "CKMBINDEX", "TROPONINI" in the last 168 hours.  HbA1C: No results found for: "HGBA1C"  CBG: No results for input(s): "GLUCAP" in the last 168 hours.  Review of Systems:   UTA- patient unable to participate in interview at this time- emergently intubated and sedated  Past Medical History:  She,  has a past medical history of Asthma, Complication of anesthesia, Congenital anomaly, and Sciatic leg pain.   Surgical History:   Past Surgical History:  Procedure Laterality Date   CESAREAN SECTION     2017     Social History:   reports that she has never smoked. She has never used  smokeless tobacco. She reports current alcohol use. She reports current drug use. Drug: Marijuana.   Family History:  Her family history is not on file.   Allergies No Known Allergies   Home Medications  Prior to Admission medications   Medication Sig Start Date End Date Taking? Authorizing Provider  albuterol (VENTOLIN HFA) 108 (90 Base) MCG/ACT inhaler Inhale 1-2 puffs into the lungs every 6 (six) hours as needed for wheezing or shortness of breath. 06/17/21   Rodriguez-Southworth, Sunday Spillers, PA-C  carisoprodol (SOMA) 350 MG tablet Take 1 tablet (350 mg total) by mouth 3 (three) times daily. 07/01/22   Crain, Whitney L, PA  fluticasone (FLOVENT HFA) 110 MCG/ACT inhaler Inhale into  the lungs. 06/23/19 06/21/22  [provider]  Fluticasone-Salmeterol (ADVAIR DISKUS) 100-50 MCG/DOSE AEPB Inhale 1 puff into the lungs 2 (two) times daily. Patient not taking: Reported on 06/21/2022 06/21/19   Marylene Land, NP  Norgestimate-Ethinyl Estradiol Triphasic 0.18/0.215/0.25 MG-25 MCG tab Take 1 tablet by mouth daily. 10/09/21   Junious Dresser, FNP     Critical care time: 58 minutes       Donell Beers, [place] Pager [phone] (please enter 7 digits) PCCM Consult Pager [phone] (please enter 7 digits)

## 2022-07-09 NOTE — ED Notes (Signed)
Dr. Dolores Frame aware of pt's BP, unable to place BP cuff on pt's bilateral arms, cuff has been attempted on pt's bilateral LE.

## 2022-07-09 NOTE — ED Notes (Addendum)
Dr. Dolores Frame notified that pt is wanting to remove the BiPap mask, requested medication to help ease her discomfort. Pt fan next to pt for cooling comfort and temp to room lower for pt's comfort as she c/o being "hot & sweating".

## 2022-07-09 NOTE — ED Notes (Signed)
Pt is trying to sit up, attempting to pull her ET tube out, vent alarming, will administer 50 mcg fentanyl as order and start sedation drips. Refer to [facility]

## 2022-07-09 NOTE — ED Notes (Addendum)
Dr. Dolores Frame notified that pt is still not handling the Bi-pap well and pt is very uncomfortable and still appears to have difficulty breathing. Advised to call Rt for assistance to try venti mask

## 2022-07-09 NOTE — ED Notes (Signed)
Verbal order for 100 (73ml) bolus per Dr. Dolores Frame Verbal order for 100 (93ml) bolus per Dr. Dolores Frame, pt is biting the ET, pt is trying to sit up in bed, and trying to pull out the ET tube at this time.

## 2022-07-09 NOTE — Procedures (Addendum)
Central Venous Catheter Insertion Procedure Note  Monique Roy  [id number]  07/14/1986  Date:07/09/22  Time:9:58 AM   Provider Performing:Kimberlyn Quiocho Earnest Conroy   Procedure: Insertion of Non-tunneled Central Venous [phone]) with US guidance ([id number])   Indication(s) Medication administration and Difficult access  Consent Risks of the procedure as well as the alternatives and risks of each were explained to the patient and/or caregiver.  Consent for the procedure was obtained and is signed in the bedside chart  Anesthesia Topical only with 1% lidocaine   Timeout Verified patient identification, verified procedure, site/side was marked, verified correct patient position, special equipment/implants available, medications/allergies/relevant history reviewed, required imaging and test results available.  Sterile Technique Maximal sterile technique including full sterile barrier drape, hand hygiene, sterile gown, sterile gloves, mask, hair covering, sterile ultrasound probe cover (if used).  Procedure Description Area of catheter insertion was cleaned with chlorhexidine and draped in sterile fashion.  With real-time ultrasound guidance a central venous catheter was placed into the left internal jugular vein. Nonpulsatile blood flow and easy flushing noted in all ports.  The catheter was sutured in place and sterile dressing applied.  Complications/Tolerance None; patient tolerated the procedure well. Chest X-ray is ordered to verify placement for internal jugular or subclavian cannulation.   Chest x-ray is not ordered for femoral cannulation.  EBL Minimal  Specimen(s) None  Pulled back central line by 5 cm to the 15 cm mark.  However, still malpositioned.  Removed left internal jugular CVL and inserted a new left internal jugular CVL @18  cm mark.  CXR confirms satisfactory position.   , AGNP  Pulmonary/Critical Care Pager [phone] (please enter 7 digits) PCCM Consult  Pager [phone] (please enter 7 digits)

## 2022-07-09 NOTE — ED Notes (Signed)
Pillow propped behind pt's back for comfort, towel rolled and placed behind pt's neck for support and comfort.

## 2022-07-09 NOTE — Progress Notes (Signed)
Pharmacy Electrolyte Monitoring Consult:  Pharmacy consulted to assist in monitoring and replacing electrolytes in this 36 y.o. female admitted on 07/09/2022 with Respiratory Distress   Labs:  Sodium (mmol/L)  Date Value  07/09/2022 142   Potassium (mmol/L)  Date Value  07/09/2022 3.5   Magnesium (mg/dL)  Date Value  82/50/5397 2.8 (H)   Calcium (mg/dL)  Date Value  67/34/1937 9.1   Albumin (g/dL)  Date Value  90/24/0973 4.1    Assessment/Plan: No electrolyte replacement at this time F/u labs in am   Monique Roy A 07/09/2022 7:37 AM

## 2022-07-09 NOTE — ED Notes (Signed)
Request for more possible ativan as pt is not handling the bi-pap well, pt is uncomfortable and the mask is causing her anxiety, Dr. Dolores Frame to place order, refer to [facility]

## 2022-07-09 NOTE — ED Notes (Signed)
Nurse Marisue Ivan of ICU was notified that pt is flu + at this time.

## 2022-07-09 NOTE — ED Provider Notes (Signed)
[facility] Provider Note    Event Date/Time   First MD Initiated Contact with Patient 07/09/22 [phone]     (approximate)   History   Respiratory Distress   HPI  Level V caveat: Limited by distress  Monique Roy is a 36 y.o. female brought to the ED via EMS from home with a chief complaint of respiratory distress.  Patient with a history of asthma, never intubated who endorses fever, cough and shortness of breath x2 to 3 days.  Associated chest tightness.  Room air saturations at the scene in the mid 80%'s.  EMS administered 2 DuoNebs and 125 mg IM Solu-Medrol prior to arrival.  Patient arrives on nonrebreather oxygen.     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 Authorizing Provider  albuterol (VENTOLIN HFA) 108 (90 Base) MCG/ACT inhaler Inhale 1-2 puffs into the lungs every 6 (six) hours as needed for wheezing or shortness of breath. 06/17/21   Rodriguez-Southworth, Nettie Elm, PA-C  carisoprodol (SOMA) 350 MG tablet Take 1 tablet (350 mg total) by mouth 3 (three) times daily. 07/01/22   Crain, Whitney L, PA  fluticasone (FLOVENT HFA) 110 MCG/ACT inhaler Inhale into the lungs. 06/23/19 06/21/22  [provider]  Fluticasone-Salmeterol (ADVAIR DISKUS) 100-50 MCG/DOSE AEPB Inhale 1 puff into the lungs 2 (two) times  daily. Patient not taking: Reported on 06/21/2022 06/21/19   Renford Dills, NP  Norgestimate-Ethinyl Estradiol Triphasic 0.18/0.215/0.25 MG-25 MCG tab Take 1 tablet by mouth daily. 10/09/21   Wendi Snipes, FNP     Allergies  Patient has no known allergies.   Family History  No family history on file.   Physical Exam  Triage Vital Signs: ED Triage Vitals [07/09/22 0351]  Enc Vitals Group     BP      Pulse      Resp      Temp      Temp src      SpO2 94 %     Weight      Height      Head Circumference      Peak Flow      Pain Score      Pain Loc      Pain Edu?      Excl. in GC?     Updated Vital Signs: BP (!) 182/101   Pulse (!) 110   Temp 98.8 F (37.1 C) (Axillary)   Resp (!) 34   Ht 5' (1.524 m)   Wt 63.5 kg   LMP 06/12/2022 (Approximate)   SpO2 100%   BMI 27.34 kg/m    General: Awake, moderate distress. Diaphoretic. CV:  Tachycardic.  Good peripheral perfusion.  Resp:  Increased effort.  BiPAP positioning.  Retractions.  Diminished  aeration diffusely. Abd:  Nontender.  No distention.  Other:  Bilateral calves are nontender and nonswollen. Ectrodctyly noted all extremities.   ED Results / Procedures / Treatments  Labs (all labs ordered are listed, but only abnormal results are displayed) Labs Reviewed  RESP PANEL BY RT-PCR (FLU A&B, COVID) ARPGX2 - Abnormal; Notable for the following components:      Result Value   Influenza A by PCR POSITIVE (*)    All other components within normal limits  CBC WITH DIFFERENTIAL/PLATELET - Abnormal; Notable for the following components:   WBC 25.5 (*)    Neutro Abs 22.8 (*)    Abs Immature Granulocytes 0.23 (*)    All other components within normal limits  COMPREHENSIVE METABOLIC PANEL - Abnormal; Notable for the following components:   Glucose, Bld 180 (*)    Total Protein 8.3 (*)    All other components within normal limits  BRAIN NATRIURETIC PEPTIDE - Abnormal; Notable for the following components:   B  Natriuretic Peptide 130.6 (*)    All other components within normal limits  MAGNESIUM - Abnormal; Notable for the following components:   Magnesium 2.8 (*)    All other components within normal limits  TROPONIN I (HIGH SENSITIVITY) - Abnormal; Notable for the following components:   Troponin I (High Sensitivity) 22 (*)    All other components within normal limits  TROPONIN I (HIGH SENSITIVITY) - Abnormal; Notable for the following components:   Troponin I (High Sensitivity) 29 (*)    All other components within normal limits  CULTURE, BLOOD (ROUTINE X 2)  CULTURE, BLOOD (ROUTINE X 2)  URINE CULTURE  LACTIC ACID, PLASMA  PROCALCITONIN  URINALYSIS, ROUTINE W REFLEX MICROSCOPIC  BLOOD GAS, VENOUS  PREGNANCY, URINE  D-DIMER, QUANTITATIVE  URINE DRUG SCREEN, QUALITATIVE ([facility] ONLY)     EKG  ED ECG REPORT I, Roni Scow J, the attending physician, personally viewed and interpreted this ECG.   Date: 07/09/2022  EKG Time: 0359  Rate: 112  Rhythm: sinus tachycardia  Axis: normal  Intervals:none  ST&T Change: Nonspecific    RADIOLOGY I have independently visualized and interpreted patient's chest x-ray as well as noted the radiology interpretation:  Chest x-ray: No acute cardiopulmonary process  Post intubation chest x-ray: ET tube 8 cm above carina  Official radiology report(s): DG Chest Portable 1 View  Result Date: 07/09/2022 CLINICAL DATA:  Status post intubation. EXAM: PORTABLE CHEST 1 VIEW COMPARISON:  07/09/2022 FINDINGS: 0639 hours. Lungs are hyperexpanded. Endotracheal tube tip is 8.2 cm above the base of the carina, positioned at the level of the sternal notch. The NG tube passes into the stomach although the distal tip position is not included on the film. The lungs are clear without focal pneumonia, edema, pneumothorax or pleural effusion. The cardiopericardial silhouette is within normal limits for size. The visualized bony structures of the thorax are unremarkable.  Telemetry leads overlie the chest. IMPRESSION: Endotracheal tube tip is 8.2 cm above the base of the carina, positioned at the level of the sternal notch. Lungs are hyperexpanded without acute cardiopulmonary findings. Electronically Signed   By: Kennith Center M.D.   On: 07/09/2022 06:57   DG Chest Port 1 View  Result Date: 07/09/2022 CLINICAL DATA:  Dyspnea EXAM: PORTABLE CHEST 1 VIEW COMPARISON:  07/08/2022 FINDINGS: The heart size and mediastinal contours are within normal limits. Both lungs are clear. The visualized skeletal structures are unremarkable. IMPRESSION: No active disease. Electronically Signed   By: Lyda Kalata.D.  On: 07/09/2022 04:20   DG Chest 2 View  Result Date: 07/08/2022 CLINICAL DATA:  Pt complains of right sided back pain and shob for a month. States went to UC but wants a third opinion. States only SHOB when has a cold and has a cold right now. EXAM: CHEST - 2 VIEW COMPARISON:  06/17/2021 FINDINGS: Lungs are clear. Heart size and mediastinal contours are within normal limits. No effusion. Visualized bones unremarkable. IMPRESSION: No acute cardiopulmonary disease. Electronically Signed   By: D  Hassell M.D.   On: 07/08/2022 14:03     PROCEDURES:  Critical Care performed: Yes, see critical care procedure note(s)  CRITICAL CARE Performed by: Idalis Hoelting J   Total critical care time: 60 minutes  Critical care time was exclusive of separately billable procedures and treating other patients.  Critical care was necessary to treat or prevent imminent or life-threatening deterioration.  Critical care was time spent personally by me on the following activities: development of treatment plan with patient and/or surrogate as well as nursing, discussions with consultants, evaluation of patient's response to treatment, examination of patient, obtaining history from patient or surrogate, ordering and performing treatments and interventions, ordering and review of laboratory  studies, ordering and review of radiographic studies, pulse oximetry and re-evaluation of patient's condition.   .1-3 Lead EKG Interpretation  Performed by: Wilmarie Sparlin J, MD Authorized by: Kindall Swaby J, MD     Interpretation: abnormal     ECG rate:  110   ECG rate assessment: tachycardic     Rhythm: sinus tachycardia     Ectopy: none     Conduction: normal   Comments:     Patient placed on cardiac monitor to evaluate for arrhythmias Procedure Name: Intubation Date/Time: 07/09/2022 6:37 AM  Performed by: Faris Coolman J, MDPre-anesthesia Checklist: Patient identified, Patient being monitored, Emergency Drugs available, Timeout performed and Suction available Oxygen Delivery Method: Ambu bag Preoxygenation: Pre-oxygenation with 100% oxygen Induction Type: Rapid sequence Ventilation: Mask ventilation without difficulty Laryngoscope Size: Glidescope and 3 Grade View: Grade II Number of attempts: 1 Airway Equipment and Method: Rigid stylet Placement Confirmation: ETT inserted through vocal cords under direct vision, CO2 detector and Breath sounds checked- equal and bilateral Difficulty Due To: Difficult Airway- due to reduced neck mobility       MEDICATIONS ORDERED IN ED: Medications  polyethylene glycol (MIRALAX / GLYCOLAX) packet 17 g (has no administration in time range)  famotidine (PEPCID) tablet 20 mg (has no administration in time range)  docusate (COLACE) 50 MG/5ML liquid 100 mg (has no administration in time range)  polyethylene glycol (MIRALAX / GLYCOLAX) packet 17 g (has no administration in time range)  midazolam (VERSED) injection 1-2 mg (has no administration in time range)  fentaNYL (SUBLIMAZE) injection 50 mcg (has no administration in time range)  fentaNYL (SUBLIMAZE) injection 50-200 mcg (has no administration in time range)  oseltamivir (TAMIFLU) 6 MG/ML suspension 75 mg (has no administration in time range)  methylPREDNISolone sodium succinate (SOLU-MEDROL) 40  mg/mL injection 40 mg (has no administration in time range)  albuterol (PROVENTIL) (2.5 MG/3ML) 0.083% nebulizer solution 2.5 mg (has no administration in time range)  cefTRIAXone (ROCEPHIN) 1 g in sodium chloride 0.9 % 100 mL IVPB (has no administration in time range)  azithromycin (ZITHROMAX) 500 mg in sodium chloride 0.9 % 250 mL IVPB (has no administration in time range)  fentaNYL in NS <MEASURE >[place]<MEASU NT>[facility]-PRE[facility] (50 mcg/hr Intravenous New Bag/Given 07/09/22 0702)  fentaNYL (SUBLIMAZE) bolus via infusion 50-100  mcg (has no administration in time range)  midazolam (VERSED) 100 mg/100 mL (1 mg/mL) premix infusion (0.5 mg/hr Intravenous New Bag/Given 07/09/22 0700)  ipratropium-albuterol (DUONEB) 0.5-2.5 (3) MG/3ML nebulizer solution 3 mL (3 mLs Nebulization Given 07/09/22 0404)  sodium chloride 0.9 % bolus 500 mL (0 mLs Intravenous Stopped 07/09/22 0441)  magnesium sulfate IVPB 2 g 50 mL (0 g Intravenous Stopped 07/09/22 0506)  LORazepam (ATIVAN) injection 0.5 mg (0.5 mg Intravenous Given 07/09/22 0414)  hydrALAZINE (APRESOLINE) injection 10 mg (10 mg Intravenous Given 07/09/22 0537)  cefTRIAXone (ROCEPHIN) 1 g in sodium chloride 0.9 % 100 mL IVPB (0 g Intravenous Stopped 07/09/22 0620)  azithromycin (ZITHROMAX) 500 mg in sodium chloride 0.9 % 250 mL IVPB (500 mg Intravenous New Bag/Given 07/09/22 0554)  LORazepam (ATIVAN) injection 1 mg (1 mg Intravenous Given 07/09/22 0548)  etomidate (AMIDATE) injection 20 mg (20 mg Intravenous Given 07/09/22 3557)  succinylcholine (ANECTINE) syringe 200 mg (200 mg Intravenous Given 07/09/22 0628)  fentaNYL (SUBLIMAZE) injection 50 mcg (50 mcg Intravenous Given 07/09/22 0654)     IMPRESSION / MDM / ASSESSMENT AND PLAN / ED COURSE  I reviewed the triage vital signs and the nursing notes.                             36 year old female presenting with respiratory distress and hypoxia. Differential includes, but is not limited to, viral syndrome,  bronchitis including COPD exacerbation, pneumonia, reactive airway disease including asthma, CHF including exacerbation with or without pulmonary/interstitial edema, pneumothorax, ACS, thoracic trauma, and pulmonary embolism.  I personally reviewed patient's records and note that she left without treatment from this ED yesterday for same.  Patient's presentation is most consistent with acute presentation with potential threat to life or bodily function.  The patient is on the cardiac monitor to evaluate for evidence of arrhythmia and/or significant heart rate changes.  We will obtain sepsis protocol work-up and chest x-ray.  Apply a BiPAP.  Administer DuoNeb and 2 mg IV magnesium.  Anticipate hospitalization.  Clinical Course as of 07/09/22 0717  Tue Jul 09, 2022  0436 Patient required low-dose IV Ativan to tolerate BiPAP. [JS]  0518 Blood pressure has been taken in the legs.  Taken in both legs and are persistently elevated; will administer IV antihypertensive.  Patient looks better on BiPAP [JS]  0544 Laboratory results demonstrate leukocytosis WBC 25.5, normal electrolytes, initial troponin 22 which is likely due to demand ischemia.  Chest x-ray clear.  Patient meets SIRS criteria; will administer IV Rocephin and Azithromycin.  Requires another dose of calming agent to keep BiPAP on.  Will consult hospitalist services for evaluation and admission. [JS]  0620 Patient decompensating, altered.  Will obtain VBG but she is starting to fatigue which is an indication for intubation.  Have notified CCU intensivist Richardean Chimera. [JS]  [phone] Patient transiently desaturated to 30% after IV propofol bolus.  With bagging sats returned to 95%.  Will cancel propofol infusion and instead switch to Fentanyl/Versed for sedation. [JS]  2542 Will ask respiratory therapy to advance ET tube. [JS]    Clinical Course User Index [JS] Irean Hong, MD     FINAL CLINICAL IMPRESSION(S) / ED DIAGNOSES   Final  diagnoses:  Respiratory distress  Moderate persistent asthma with exacerbation  SIRS (systemic inflammatory response syndrome) (HCC)  Hypertension, unspecified type  Influenza A  Acute respiratory failure, unspecified whether with hypoxia or hypercapnia (HCC)  Rx / DC Orders   ED Discharge Orders     None        Note:  This document was prepared using Dragon voice recognition software and may include unintentional dictation errors.   Irean Hong, MD 07/09/22 [phone]

## 2022-07-09 NOTE — Progress Notes (Signed)
Initial Nutrition Assessment  DOCUMENTATION CODES:   Not applicable  INTERVENTION:   Vital 1.2@55ml /hr- Initiate at 81ml/hr and increase by 39ml/hr q 8 hours until goal rate is reached  Free water flushes 60ml q4 hours to maintain tube patency   Regimen provides 1584kcal/day, 99g/day protein and 13100ml/day of free water.   Pt at refeed risk; recommend monitor potassium, magnesium and phosphorus labs daily until stable  Daily weights  NUTRITION DIAGNOSIS:   Inadequate oral intake related to inability to eat (pt sedated and ventilated) as evidenced by NPO status.  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  Pt sedated and ventilated. OGT in place. Will plan to initiate tube feeds today. Per chart, pt appears to be down 9lbs(6%) since March; this is not significant.   Medications reviewed and include: colace, pepcid, lovenox, insulin, solu-medrol, miralax, azithromycin, ceftriaxone  Labs reviewed: K 3.5 wnl, Mg 2.8(H) Wbc- 25.5(H) Cbgs- 136, 149, 177 x 24 hrs  Patient is currently intubated on ventilator support MV: 10.0 L/min Temp (24hrs), Avg:99.7 F (37.6 C), Min:98.8 F (37.1 C), Max:101.7 F (38.7 C)  Propofol: none   MAP- >13mmHg   UOP-   NUTRITION - FOCUSED PHYSICAL EXAM:  Flowsheet Row Most Recent Value  Orbital Region No depletion  Upper Arm Region No depletion  Thoracic and Lumbar Region No depletion  Buccal Region No depletion  Temple Region No depletion  Clavicle Bone Region No depletion  Clavicle and Acromion Bone Region No depletion  Scapular Bone Region No depletion  Dorsal Hand No depletion  Patellar Region No depletion  Anterior Thigh Region No depletion  Posterior Calf Region No depletion   Edema (RD Assessment) None  Hair Reviewed  Eyes Reviewed  Mouth Reviewed  Skin Reviewed  Nails Reviewed   Diet Order:   Diet Order             Diet NPO time specified  Diet effective now                  EDUCATION NEEDS:   No education needs have been identified at this time  Skin:  Skin Assessment: Reviewed RN Assessment  Last BM:  pta  Height:   Ht Readings from Last 1 Encounters:  07/09/22 5' (1.524 m)    Weight:   Wt Readings from Last 1 Encounters:  07/09/22 63.5 kg    Ideal Body Weight:  45.4 kg  BMI:  Body mass index is 27.34 kg/m.  Estimated Nutritional Needs:   Kcal:  1765kcal/day  Protein:  90-100g/day  Fluid:  1.4-1.6L/day  Betsey Holiday MS, RD, LDN Please refer to [facility] for RD and/or RD on-call/weekend/after hours pager

## 2022-07-09 NOTE — ED Notes (Signed)
RT at the bedside.

## 2022-07-09 NOTE — ED Notes (Signed)
Pt belongings included a phone that is in a belongings bag with the pt's name on the bag. No clothes was found with pt when this nurse arrived to take over care of pt.

## 2022-07-09 NOTE — Procedures (Signed)
Arterial Catheter Insertion Procedure Note  Monique Roy  [id number]  06-14-1986  Date:07/09/22  Time:9:59 AM    Provider Performing: Ezequiel Essex    Procedure: Insertion of Arterial Line (29191) with US guidance (66060)   Indication(s) Blood pressure monitoring and/or need for frequent ABGs  Consent Risks of the procedure as well as the alternatives and risks of each were explained to the patient and/or caregiver.  Consent for the procedure was obtained and is signed in the bedside chart  Anesthesia None   Time Out Verified patient identification, verified procedure, site/side was marked, verified correct patient position, special equipment/implants available, medications/allergies/relevant history reviewed, required imaging and test results available.   Sterile Technique Maximal sterile technique including full sterile barrier drape, hand hygiene, sterile gown, sterile gloves, mask, hair covering, sterile ultrasound probe cover (if used).   Procedure Description Area of catheter insertion was cleaned with chlorhexidine and draped in sterile fashion. With real-time ultrasound guidance an arterial catheter was placed into the left femoral artery.  Appropriate arterial tracings confirmed on monitor.     Complications/Tolerance None; patient tolerated the procedure well.   EBL Minimal   Specimen(s) None  Monique Roy, AGNP  Pulmonary/Critical Care Pager [phone] (please enter 7 digits) PCCM Consult Pager [phone] (please enter 7 digits)

## 2022-07-09 NOTE — ED Notes (Signed)
Pt's sats on the vent decreased to lower 30s, this RN began bagging pt on the vent, with + oxygenation w/o resistance and sats immediatly increased back to the 90s while remaining on the vent, Dr. Dolores Frame, charge RN Erie Noe, and RT called to bedside... Verbal order to d/c propofol drip and switch to fentanyl and versed drip for sedation.

## 2022-07-09 NOTE — Progress Notes (Signed)
Updated pts sister Rise Paganini via telephone regarding pts condition and current plan of care.  All questions were answered.   Zada Girt, AGNP  Pulmonary/Critical Care Pager [phone] (please enter 7 digits) PCCM Consult Pager [phone] (please enter 7 digits)

## 2022-07-09 NOTE — ED Triage Notes (Addendum)
Pt from home BIB [facility] after leaving AMA earlier yesterday. Pt c/o sob and cough. FD arrived on scene sats low 80s on RA, pt placed on NRB. EMS gave 2 DuoNeb/albuterol in route, attempted 3, but pt could not tolerate mask, 125 Solumedrol IM as well. . Initially pt labored breathing, unable to speak, tachycardiac, and auscultation was silent/tight per EMS. After treatment in route, pt graduated to RA at 94% and bilateral lung field + wheezing. Pt alert on arrival, tachycardiac, tachypnea, wheezing, sats 55 on RA, NRB applied, RT at bedside to place pt on Bi-Pap.

## 2022-07-09 NOTE — TOC Initial Note (Signed)
Transition of Care [facility]) - Initial/Assessment Note    Patient Details  Name: Monique Roy MRN: [id number] Date of Birth: 11-Nov-1985  [facility] CM/SW Contact:    Allayne Butcher, RN Phone Number: 07/09/2022, 11:47 AM  Clinical Narrative:                 Patient admitted to the hospital with acute asthma exacerbation and influenza A, currently intubated and sedated in the ICU.  RNCM was able to speak with patient's sister, Valentina Gu.  Lucy lives in [place], she reports that patient lives with her 3 children, 4, 43 and 22 years old in [place], single parent.  Patient is close with her cousins and her 2 cousins are sharing responsibility for the children making sure they get to school and get picked up.  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    TOC will follow for needs.   Expected Discharge Plan: Home/Self Care Barriers to Discharge: Continued Medical Work up   Patient Goals and CMS Choice Patient states their goals for this hospitalization and ongoing recovery are:: intubated, unable to state      Expected Discharge Plan and Services Expected Discharge Plan: Home/Self Care       Living arrangements for the past 2 months: Single Family Home                 DME Arranged: N/A DME Agency: NA       HH Arranged: NA HH Agency: NA        Prior Living Arrangements/Services Living arrangements for the past 2 months: Single Family Home Lives with:: Minor Children Patient language and need for interpreter reviewed:: Yes        Need for Family Participation in Patient Care: Yes (Comment) Care giver support system in place?: Yes (comment)   Criminal Activity/Legal Involvement Pertinent to Current Situation/Hospitalization: No - Comment as needed  Activities of Daily Living      Permission Sought/Granted      Share Information with NAME: Rise Paganini      Permission granted to share info w Relationship: sister  Permission granted to share info w Contact Information: [phone]  Emotional Assessment Appearance:: Appears stated age Attitude/Demeanor/Rapport: Intubated (Following Commands or Not Following Commands) Affect (typically observed): Unable to Assess   Alcohol / Substance Use: Not Applicable Psych Involvement: No (comment)  Admission diagnosis:  Acute respiratory failure (HCC) [J96.00] Respiratory distress [R06.03] Influenza A [J10.1] Moderate persistent asthma with exacerbation [J45.41] SIRS (systemic inflammatory response syndrome) (HCC) [R65.10] Acute asthma exacerbation [J45.901] Acute respiratory failure, unspecified whether with hypoxia or hypercapnia (HCC) [J96.00] Hypertension, unspecified type [I10] Patient Active Problem List   Diagnosis Date Noted   Acute asthma exacerbation 07/09/2022   Acute respiratory failure (HCC) 07/09/2022   Influenza A 07/09/2022   Miscarriage 02/23/2021   Morbid obesity (HCC) BMI=31.2 02/22/2021   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

## 2022-07-09 NOTE — ED Notes (Signed)
20 mg etomidate IV pushed right upper arm @ 0627 200 mg succ IV pushed right upper arm @ 0628  ET (7.5) 20 @ teeth, intubated successfully @ 0630 + color change + bilateral breath sound  Xray called to beside

## 2022-07-10 ENCOUNTER — Other Ambulatory Visit: Payer: Self-pay

## 2022-07-10 ENCOUNTER — Inpatient Hospital Stay (HOSPITAL_COMMUNITY)
Admit: 2022-07-10 | Discharge: 2022-07-10 | Disposition: A | Discharge status: Home / Self Care | Payer: Medicare Other | Attending: Critical Care Medicine | Admitting: Critical Care Medicine

## 2022-07-10 DIAGNOSIS — R0603 Acute respiratory distress: Secondary | ICD-10-CM

## 2022-07-10 DIAGNOSIS — J45901 Unspecified asthma with (acute) exacerbation: Secondary | ICD-10-CM | POA: Diagnosis not present

## 2022-07-10 DIAGNOSIS — J9601 Acute respiratory failure with hypoxia: Secondary | ICD-10-CM

## 2022-07-10 LAB — BLOOD GAS, ARTERIAL
Acid-Base Excess: 5.7 mmol/L — ABNORMAL HIGH (ref 0.0–2.0)
Acid-Base Excess: 4.3 mmol/L — ABNORMAL HIGH (ref 0.0–2.0)
Bicarbonate: 34 mmol/L — ABNORMAL HIGH (ref 20.0–28.0)
Bicarbonate: 31.5 mmol/L — ABNORMAL HIGH (ref 20.0–28.0)
FIO2: 40 %
FIO2: 0.4 %
MECHVT: 500 mL
MECHVT: 500 mL
Mechanical Rate: 16
O2 Saturation: 99.8 %
O2 Saturation: 100 %
PEEP: 5 cmH2O
PEEP: 5 cmH2O
Patient temperature: 37.3
Patient temperature: 37
RATE: 14 resp/min
RATE: 16 resp/min
pCO2 arterial: 67 mmHg (ref 32–48)
pCO2 arterial: 57 mmHg — ABNORMAL HIGH (ref 32–48)
pH, Arterial: 7.32 — ABNORMAL LOW (ref 7.35–7.45)
pH, Arterial: 7.35 (ref 7.35–7.45)
pO2, Arterial: 135 mmHg — ABNORMAL HIGH (ref 83–108)
pO2, Arterial: 140 mmHg — ABNORMAL HIGH (ref 83–108)

## 2022-07-10 LAB — CBC
HCT: 38.8 % (ref 36.0–46.0)
Hemoglobin: 11.9 g/dL — ABNORMAL LOW (ref 12.0–15.0)
MCH: 28.8 pg (ref 26.0–34.0)
MCHC: 30.7 g/dL (ref 30.0–36.0)
MCV: 93.9 fL (ref 80.0–100.0)
Platelets: 271 10*3/uL (ref 150–400)
RBC: 4.13 MIL/uL (ref 3.87–5.11)
RDW: 14.6 % (ref 11.5–15.5)
WBC: 15.7 10*3/uL — ABNORMAL HIGH (ref 4.0–10.5)
nRBC: 0 % (ref 0.0–0.2)

## 2022-07-10 LAB — GLUCOSE, CAPILLARY
Glucose-Capillary: 142 mg/dL — ABNORMAL HIGH (ref 70–99)
Glucose-Capillary: 154 mg/dL — ABNORMAL HIGH (ref 70–99)
Glucose-Capillary: 162 mg/dL — ABNORMAL HIGH (ref 70–99)
Glucose-Capillary: 151 mg/dL — ABNORMAL HIGH (ref 70–99)
Glucose-Capillary: 122 mg/dL — ABNORMAL HIGH (ref 70–99)

## 2022-07-10 LAB — URINE CULTURE: Culture: NO GROWTH

## 2022-07-10 LAB — BASIC METABOLIC PANEL
Anion gap: 1 — ABNORMAL LOW (ref 5–15)
Anion gap: 2 — ABNORMAL LOW (ref 5–15)
BUN: 12 mg/dL (ref 6–20)
BUN: 11 mg/dL (ref 6–20)
CO2: 31 mmol/L (ref 22–32)
CO2: 29 mmol/L (ref 22–32)
Calcium: 8.5 mg/dL — ABNORMAL LOW (ref 8.9–10.3)
Calcium: 8.1 mg/dL — ABNORMAL LOW (ref 8.9–10.3)
Chloride: 108 mmol/L (ref 98–111)
Chloride: 109 mmol/L (ref 98–111)
Creatinine, Ser: 0.74 mg/dL (ref 0.44–1.00)
Creatinine, Ser: 0.67 mg/dL (ref 0.44–1.00)
GFR, Estimated: >60 mL/min (ref >60)
GFR, Estimated: >60 mL/min (ref >60)
Glucose, Bld: 144 mg/dL — ABNORMAL HIGH (ref 70–99)
Glucose, Bld: 147 mg/dL — ABNORMAL HIGH (ref 70–99)
Potassium: 5.4 mmol/L — ABNORMAL HIGH (ref 3.5–5.1)
Potassium: 5.1 mmol/L (ref 3.5–5.1)
Sodium: 140 mmol/L (ref 135–145)
Sodium: 140 mmol/L (ref 135–145)

## 2022-07-10 LAB — PROCALCITONIN: Procalcitonin: 1.6 ng/mL

## 2022-07-10 LAB — ECHOCARDIOGRAM COMPLETE
Height: 60 in
S' Lateral: 2.5 cm
Weight: 2239.87 oz

## 2022-07-10 LAB — MAGNESIUM
Magnesium: 2.7 mg/dL — ABNORMAL HIGH (ref 1.7–2.4)
Magnesium: 2.6 mg/dL — ABNORMAL HIGH (ref 1.7–2.4)

## 2022-07-10 LAB — PHOSPHORUS
Phosphorus: 1.8 mg/dL — ABNORMAL LOW (ref 2.5–4.6)
Phosphorus: 1.7 mg/dL — ABNORMAL LOW (ref 2.5–4.6)

## 2022-07-10 LAB — HEMOGLOBIN A1C
Hgb A1c MFr Bld: 5.7 % — ABNORMAL HIGH (ref 4.8–5.6)
Mean Plasma Glucose: 117 mg/dL

## 2022-07-10 LAB — TROPONIN I (HIGH SENSITIVITY)
Troponin I (High Sensitivity): 8 ng/L (ref <18)
Troponin I (High Sensitivity): 7 ng/L (ref <18)

## 2022-07-10 MED ORDER — POTASSIUM & SODIUM PHOSPHATES 280-160-250 MG PO PACK
2.0000 | PACK | Freq: Once | ORAL | Status: AC
  Administered 2022-07-10: 2
  Filled 2022-07-10: qty 2

## 2022-07-10 MED ORDER — KETAMINE HCL 10 MG/ML IJ SOLN
0.5000 mg/kg/h | Status: DC
  Administered 2022-07-10: 0.5 mg/kg/h via INTRAVENOUS
  Filled 2022-07-10: qty 100

## 2022-07-10 NOTE — Progress Notes (Signed)
*  PRELIMINARY RESULTS* Echocardiogram 2D Echocardiogram has been performed.  Monique Roy 07/10/2022, 8:34 AM

## 2022-07-10 NOTE — Progress Notes (Addendum)
Pharmacy Electrolyte Monitoring Consult:  Pharmacy consulted to assist in monitoring and replacing electrolytes in this 36 y.o. female admitted on 07/09/2022 with Respiratory Distress   Labs:  Sodium (mmol/L)  Date Value  07/10/2022 140   Potassium (mmol/L)  Date Value  07/10/2022 5.1   Magnesium (mg/dL)  Date Value  22/33/6122 2.6 (H)   Phosphorus (mg/dL)  Date Value  44/97/5300 1.7 (L)   Calcium (mg/dL)  Date Value  51/05/2110 8.1 (L)   Albumin (g/dL)  Date Value  73/56/7014 4.1   Nutrition: Vital AF at 45 mL/hr + FWF 50 mL q4h  Assessment/Plan: --PHOS-NAK 2 packets per tube x 1 (contains 500 mg elemental phosphorus per tube) --recheck electrolytes in am  Lowella Bandy 07/10/2022 7:31 AM

## 2022-07-10 NOTE — Progress Notes (Signed)
NAME:  Monique Roy, MRN:  [id number], DOB:  [birth year], LOS: 1 ADMISSION DATE:  07/09/2022, CONSULTATION DATE:  07/09/22 REFERRING MD:  Dr. Beather Arbour, CHIEF COMPLAINT:     Brief Pt Description / Synopsis:  36 y.o. female admitted with Acute Hypoxic / Hypercapnic Respiratory Failure secondary to Asthma Exacerbation in the setting of Influenza A infection requiring intubation & mechanical ventilation.  History of Present Illness:  36 yo F presenting to [facility] ED from home via EMS, after leaving AMA from the ED on 07/08/22, for evaluation of shortness of breath & cough.  EMS reported hypoxia upon arrival with SpO2 in the 80's on RA. She was placed on a NRB, Duoneb/albuterol x 2 & 125 solumedrol IM.  ED course: Upon arrival patient alert, tachycardic, hypertensive, tachypneic, wheezing with SpO2 in the 50's on RA, patient transitioned to BIPAP support. Unfortunately, she did not tolerate BIPAP due to anxiety. When placed on [place] she became hypoxic in the low 80's. She was administered ativan to help tolerate the BIPAP. Over time the patient became more agitated and still in distress, the decision was made to emergently intubate the patient and provide mechanical ventilatory support. She met SIRS criteria and empiric antibiotic coverage ordered. Labs significant for + Influenza A, leukocytosis, elevated PCT, mildly elevated BNP, mildly elevated Troponin, mild hyperglycemia. CXR clear on imaging, UA pending. Medications given: Rocephin & Azithromycin, ativan, etomidate & succinylcholine, NS 500 mL Bolus, Mag 2 g, duo neb, hydralazine Initial Vitals: 98.8, 25, 110, 200/117 & 99% on BIPAP Significant labs: (Labs/ Imaging personally reviewed) I, Domingo Pulse Rust-Chester, AGACNP-BC, personally viewed and interpreted this ECG. EKG Interpretation: Date: 07/09/22, EKG Time: 03:59, Rate: 112, Rhythm: ST, QRS Axis:  normal, Intervals: bi-atrial enlargement, ST/T Wave abnormalities: Inferior ST depression, Narrative  Interpretation: ST with inferior ST depression and bi-atrial enlargement Chemistry: Na+: 142, K+: 3.5, BUN/Cr.: 8/0.80, Serum CO2/ AG: 26/10 Hematology: WBC: 25.5, Hgb: 14.3,  Troponin: 22, BNP: 130.6, Lactic/ PCT: 1.4/ 0.48,  COVID-19 & Influenza A/B: Influenza A +  CXR 07/09/22: no acute cardiopulmonary disease   Pt admitted to ICU acute respiratory failure mechanically intubated.   Please see "Significant Hospital Events" section below for full detailed hospital course.  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  Adding Ketamine for both bronchodilating effect and sedation.  Tracheal aspirate with Gram + cocci  Interim History / Subjective:  -No significant events noted overnight -Afebrile, hemodynamically stable, no vasopressors -40% FiO2 on vent, ABG slowly improving, this am with diminished breath sounds throughout (described yesterday as minimal) -Discussed with Dr. Mortimer Fries ~ will add Ketamine for Bronchodilating effect and sedation -Tracheal aspirate with gram + cocci ~ further cultures and sensitivities still pending ~ will continue Azithromycin and Rocephin for now -Echocardiogram is pending   Objective   Blood pressure 116/60, pulse (!) 110, temperature 97.9 F (36.6 C), resp. rate 16, height 5' (1.524 m), weight 63.5 kg, last menstrual period  06/12/2022,  SpO2 100 %.    Vent Mode: PRVC FiO2 (%):  [40 %] 40 % Set Rate:  [14 bmp-20 bmp] 16 bmp Vt Set:  [420 mL-500 mL] 500 mL PEEP:  [5 cmH20] 5 cmH20 Plateau Pressure:  [24 cmH20-29 cmH20] 24 cmH20   Intake/Output Summary (Last 24 hours) at 07/10/2022 2841 Last data filed at 07/10/2022 0818 Gross per 24 hour  Intake 1820.74 ml  Output 930 ml  Net 890.74 ml    Filed Weights   07/09/22 0442 07/10/22 0402  Weight: 63.5 kg 63.5 kg    Examination: General: Acute on chronically-ill appearing female, sedated, on vent, in NAD HENT: atraumatic, normocephalic, neck supple, no JVD Lungs: Diminished breath sounds throughout, synchrouns with vent, even, nonlabored  Cardiovascular: Sinus tachycardia, 1+ brachial/1+ distal pulses  Abdomen: +BS x4, obese, soft, non tender, non distended  Extremities: BUE and BLE congenital abnormalities  Neuro: Sedated, RASS -2, withdraws from pain, but currently not following commands, PERRL GU: Indwelling catheter draining yellow urine   Resolved Hospital Problem list     Assessment & Plan:   Acute Hypoxic / Hypercapnic Respiratory Failure secondary to Asthma Exacerbation in the setting of Influenza A infection PMHx: Asthma -Full vent support, implement lung protective strategies -Plateau pressures less than 30 cm H20 -Wean FiO2 & PEEP as tolerated to maintain O2 sats >90% -Follow intermittent Chest X-ray & ABG as needed -Spontaneous Breathing Trials when respiratory parameters met and mental status permits -Implement VAP Bundle -Bronchodilators & Pulmicort nebs -IV steroids (Solumedrol 40 mg BID) -Start Ketamine gtt 12/6 -ABX as above  Mildly Elevated Troponin secondary to demand ischemia HTN -Continuous cardiac monitoring -Maintain MAP >65 -IV fluids -Vasopressors as needed to maintain MAP goal ~ not requiring -HS Troponin peaked at 39 -Echocardiogram pending  Influenza A infection Suspected superimposed CAP -Monitor fever  curve -Trend WBC's & Procalcitonin -Follow cultures as above -Continue empiric Azithromycin, Ceftriaxone, and Tamiflu pending cultures & sensitivities  Steroid-induced hyperglycemia  -CBG's q4h; Target range of 140 to 180 -SSI -Follow ICU Hypo/Hyperglycemia protocol  Sedation needs in setting of mechanical ventilation -Maintain a RASS goal of -1 to -2 (higher RASS goal due to vasospasm with difficultly to ventilate) -Fentanyl and Versed as needed to maintain RASS goal -Avoid sedating medications as able -Daily wake up assessment    Pt is critically ill, prognosis is guarded.  High risk for cardiac arrest and death.   Best Practice (right click and "Reselect all SmartList Selections" daily)  Diet/type: TF's  DVT prophylaxis: LMWH GI prophylaxis: H2B Lines: Central line and arterial line, both still indicated Foley:  Yes, and it is still needed Code Status:  full code Last date of multidisciplinary goals of care discussion [07/10/22]  12/6: Will update pt's family when they arrive at bedside  Labs   CBC: Recent Labs  Lab 07/09/22 0427 07/10/22 0402  WBC 25.5* 15.7*  NEUTROABS 22.8*  --   HGB 14.3 11.9*  HCT 45.6 38.8  MCV 91.0 93.9  PLT 354 271     Basic Metabolic Panel: Recent Labs  Lab 07/09/22 0427 07/10/22 0402 07/10/22 0526  NA 142 140 140  K 3.5 5.4* 5.1  CL 106 108 109  CO2 _0 GLUCOSE 180* 144* 147*  BUN _1 CREATININE 0.80 0.74 0.67  CALCIUM 9.1 8.5* 8.1*  MG 2.8* 2.7* 2.6*  PHOS  --  1.8* 1.7*    GFR: Estimated Creatinine Clearance: 81.7 mL/min (by C-G formula based on SCr of  0.67 mg/dL). Recent Labs  Lab 07/09/22 0427 07/10/22 0402  PROCALCITON 0.48 1.60  WBC 25.5* 15.7*  LATICACIDVEN 1.4  --      Liver Function Tests: Recent Labs  Lab 07/09/22 0427  AST 38  ALT 29  ALKPHOS 55  BILITOT 0.7  PROT 8.3*  ALBUMIN 4.1    No results for input(s): "LIPASE", "AMYLASE" in the last 168 hours. No results for input(s):  "AMMONIA" in the last 168 hours.  ABG    Component Value Date/Time   PHART 7.35 07/10/2022 0819   PCO2ART 57 (H) 07/10/2022 0819   PO2ART 140 (H) 07/10/2022 0819   HCO3 31.5 (H) 07/10/2022 0819   ACIDBASEDEF 0.6 07/09/2022 0615   O2SAT 100 07/10/2022 0819     Coagulation Profile: No results for input(s): "INR", "PROTIME" in the last 168 hours.  Cardiac Enzymes: No results for input(s): "CKTOTAL", "CKMB", "CKMBINDEX", "TROPONINI" in the last 168 hours.  HbA1C: Hgb A1c MFr Bld  Date/Time Value Ref Range Status  07/09/2022 10:20 AM 5.7 (H) 4.8 - 5.6 % Final    Comment:    (NOTE)         Prediabetes: 5.7 - 6.4         Diabetes: >6.4         Glycemic control for adults with diabetes: <7.0     CBG: Recent Labs  Lab 07/09/22 1540 07/09/22 1952 07/09/22 2351 07/10/22 0337 07/10/22 0816  GLUCAP 135* 132* 163* 142* 154*    Review of Systems:   UTA- intubated and sedated  Past Medical History:  She,  has a past medical history of Asthma, Complication of anesthesia, Congenital anomaly, and Sciatic leg pain.   Surgical History:   Past Surgical History:  Procedure Laterality Date   CESAREAN SECTION     2017     Social History:   reports that she has never smoked. She has never used smokeless tobacco. She reports current alcohol use. She reports current drug use. Drug: Marijuana.   Family History:  Her family history is not on file.   Allergies No Known Allergies   Home Medications  Prior to Admission medications   Medication Sig Start Date End Date Taking? Authorizing Provider  albuterol (VENTOLIN HFA) 108 (90 Base) MCG/ACT inhaler Inhale 1-2 puffs into the lungs every 6 (six) hours as needed for wheezing or shortness of breath. 06/17/21   Rodriguez-Southworth, Sunday Spillers, PA-C  carisoprodol (SOMA) 350 MG tablet Take 1 tablet (350 mg total) by mouth 3 (three) times daily. 07/01/22   Crain, Whitney L, PA  fluticasone (FLOVENT HFA) 110 MCG/ACT inhaler Inhale into the  lungs. 06/23/19 06/21/22  [provider]  Fluticasone-Salmeterol (ADVAIR DISKUS) 100-50 MCG/DOSE AEPB Inhale 1 puff into the lungs 2 (two) times daily. Patient not taking: Reported on 06/21/2022 06/21/19   Marylene Land, NP  Norgestimate-Ethinyl Estradiol Triphasic 0.18/0.215/0.25 MG-25 MCG tab Take 1 tablet by mouth daily. 10/09/21   Junious Dresser, FNP     Critical care time: 40 minutes     Darel Hong, AGACNP-BC [facility] Prefer epic messenger for cross cover needs If after hours, please call E-link

## 2022-07-11 ENCOUNTER — Inpatient Hospital Stay: Payer: Medicare Other

## 2022-07-11 DIAGNOSIS — J101 Influenza due to other identified influenza virus with other respiratory manifestations: Secondary | ICD-10-CM | POA: Diagnosis not present

## 2022-07-11 DIAGNOSIS — R0603 Acute respiratory distress: Secondary | ICD-10-CM | POA: Diagnosis not present

## 2022-07-11 DIAGNOSIS — J9601 Acute respiratory failure with hypoxia: Secondary | ICD-10-CM | POA: Diagnosis not present

## 2022-07-11 LAB — RENAL FUNCTION PANEL
Albumin: 3.4 g/dL — ABNORMAL LOW (ref 3.5–5.0)
Anion gap: 6 (ref 5–15)
BUN: 14 mg/dL (ref 6–20)
CO2: 31 mmol/L (ref 22–32)
Calcium: 8.6 mg/dL — ABNORMAL LOW (ref 8.9–10.3)
Chloride: 105 mmol/L (ref 98–111)
Creatinine, Ser: 0.65 mg/dL (ref 0.44–1.00)
GFR, Estimated: >60 mL/min (ref >60)
Glucose, Bld: 150 mg/dL — ABNORMAL HIGH (ref 70–99)
Phosphorus: <1 mg/dL — CL (ref 2.5–4.6)
Potassium: 3.9 mmol/L (ref 3.5–5.1)
Sodium: 142 mmol/L (ref 135–145)

## 2022-07-11 LAB — CBC
HCT: 41.4 % (ref 36.0–46.0)
Hemoglobin: 12.4 g/dL (ref 12.0–15.0)
MCH: 28.1 pg (ref 26.0–34.0)
MCHC: 30 g/dL (ref 30.0–36.0)
MCV: 93.9 fL (ref 80.0–100.0)
Platelets: 305 10*3/uL (ref 150–400)
RBC: 4.41 MIL/uL (ref 3.87–5.11)
RDW: 14.6 % (ref 11.5–15.5)
WBC: 18.6 10*3/uL — ABNORMAL HIGH (ref 4.0–10.5)
nRBC: 0 % (ref 0.0–0.2)

## 2022-07-11 LAB — CULTURE, RESPIRATORY W GRAM STAIN: Culture: NORMAL

## 2022-07-11 LAB — BLOOD GAS, ARTERIAL
Acid-Base Excess: 7.8 mmol/L — ABNORMAL HIGH (ref 0.0–2.0)
Bicarbonate: 34.2 mmol/L — ABNORMAL HIGH (ref 20.0–28.0)
O2 Saturation: 99.9 %
Patient temperature: 37
pCO2 arterial: 54 mmHg — ABNORMAL HIGH (ref 32–48)
pH, Arterial: 7.41 (ref 7.35–7.45)
pO2, Arterial: 110 mmHg — ABNORMAL HIGH (ref 83–108)

## 2022-07-11 LAB — PROCALCITONIN: Procalcitonin: 0.81 ng/mL

## 2022-07-11 LAB — GLUCOSE, CAPILLARY
Glucose-Capillary: 106 mg/dL — ABNORMAL HIGH (ref 70–99)
Glucose-Capillary: 114 mg/dL — ABNORMAL HIGH (ref 70–99)
Glucose-Capillary: 143 mg/dL — ABNORMAL HIGH (ref 70–99)
Glucose-Capillary: 146 mg/dL — ABNORMAL HIGH (ref 70–99)
Glucose-Capillary: 177 mg/dL — ABNORMAL HIGH (ref 70–99)
Glucose-Capillary: 208 mg/dL — ABNORMAL HIGH (ref 70–99)

## 2022-07-11 LAB — MAGNESIUM: Magnesium: 2.6 mg/dL — ABNORMAL HIGH (ref 1.7–2.4)

## 2022-07-11 MED ORDER — POTASSIUM PHOSPHATES 15 MMOLE/5ML IV SOLN
45.0000 mmol | Freq: Once | INTRAVENOUS | Status: AC
  Administered 2022-07-11: 45 mmol via INTRAVENOUS
  Filled 2022-07-11: qty 15

## 2022-07-11 MED ORDER — IPRATROPIUM-ALBUTEROL 0.5-2.5 (3) MG/3ML IN SOLN
3.0000 mL | Freq: Four times a day (QID) | RESPIRATORY_TRACT | Status: DC
  Administered 2022-07-11 – 2022-07-12 (×3): 3 mL via RESPIRATORY_TRACT
  Filled 2022-07-11 (×3): qty 3

## 2022-07-11 MED ORDER — FUROSEMIDE 10 MG/ML IJ SOLN
20.0000 mg | Freq: Once | INTRAMUSCULAR | Status: AC
  Administered 2022-07-11: 20 mg via INTRAVENOUS
  Filled 2022-07-11: qty 2

## 2022-07-11 MED ORDER — ADULT MULTIVITAMIN W/MINERALS CH
1.0000 | ORAL_TABLET | Freq: Every day | ORAL | Status: DC
  Administered 2022-07-11 – 2022-07-13 (×3): 1 via ORAL
  Filled 2022-07-11 (×3): qty 1

## 2022-07-11 MED ORDER — ACETAMINOPHEN 325 MG PO TABS
650.0000 mg | ORAL_TABLET | Freq: Four times a day (QID) | ORAL | Status: DC | PRN
  Administered 2022-07-11 – 2022-07-12 (×3): 650 mg via ORAL
  Filled 2022-07-11 (×3): qty 2

## 2022-07-11 MED ORDER — INSULIN ASPART 100 UNIT/ML IJ SOLN: 0.0000 [IU] | Freq: Every day | INTRAMUSCULAR | Status: DC

## 2022-07-11 MED ORDER — ALBUTEROL SULFATE (2.5 MG/3ML) 0.083% IN NEBU: 2.5000 mg | INHALATION_SOLUTION | RESPIRATORY_TRACT | Status: DC | PRN

## 2022-07-11 MED ORDER — DEXMEDETOMIDINE HCL IN NACL 400 MCG/100ML IV SOLN
0.4000 ug/kg/h | INTRAVENOUS | Status: DC
  Administered 2022-07-11: 0.4 ug/kg/h via INTRAVENOUS
  Filled 2022-07-11: qty 100

## 2022-07-11 MED ORDER — ENSURE ENLIVE PO LIQD
237.0000 mL | Freq: Two times a day (BID) | ORAL | Status: DC
  Administered 2022-07-11 – 2022-07-12 (×2): 237 mL via ORAL

## 2022-07-11 MED ORDER — METHYLPREDNISOLONE SODIUM SUCC 40 MG IJ SOLR
20.0000 mg | Freq: Two times a day (BID) | INTRAMUSCULAR | Status: AC
  Administered 2022-07-11 – 2022-07-12 (×3): 20 mg via INTRAVENOUS
  Filled 2022-07-11 (×3): qty 1

## 2022-07-11 MED ORDER — INSULIN ASPART 100 UNIT/ML IJ SOLN
0.0000 [IU] | Freq: Three times a day (TID) | INTRAMUSCULAR | Status: DC
  Administered 2022-07-11 – 2022-07-12 (×2): 2 [IU] via SUBCUTANEOUS
  Filled 2022-07-11 (×2): qty 1

## 2022-07-11 MED ORDER — OXYCODONE HCL 5 MG PO TABS
5.0000 mg | ORAL_TABLET | Freq: Four times a day (QID) | ORAL | Status: DC | PRN
  Administered 2022-07-11: 5 mg via ORAL
  Filled 2022-07-11: qty 1

## 2022-07-11 NOTE — Progress Notes (Signed)
Nutrition Follow-up  DOCUMENTATION CODES:   Not applicable  INTERVENTION:   -D/c Vital AF 1.2, as pt no longer has feeding access  -Ensure Enlive po BID, each supplement provides 350 kcal and 20 grams of protein -MVI with minerals daily  NUTRITION DIAGNOSIS:   Inadequate oral intake related to inability to eat (pt sedated and ventilated) as evidenced by NPO status.  Progressing; advanced to regular diet on 07/11/22  GOAL:   Patient will meet greater than or equal to 90% of their needs  Ongoing  MONITOR:   PO intake, Supplement acceptance  REASON FOR ASSESSMENT:   Ventilator    ASSESSMENT:   36 y/o female with h/o congenital anomaly, asthma and marijuana use who is admitted with Flu A.  12/7- extubated, OGT removed, advanced to regular diet  Reviewed I/O's: +1.4 L x 24 hours and +2.8 L since admission  UOP: 972 ml x 24 hours   Case discussed with RN, MD, and during ICU rounds. Pt extubated this morning and plans to transition to SDU.   Medications reviewed and include colace, miralax, solu-medrol, and potassium phosphate.   Labs reviewed: CBGS: 143 (inpatient orders for glycemic control are 0-15 units insulin aspart TID with meals and 0-5 units insulin aspart daily at bedtime).    Diet Order:   Diet Order             Diet regular Room service appropriate? Yes; Fluid consistency: Thin  Diet effective now                   EDUCATION NEEDS:   No education needs have been identified at this time  Skin:  Skin Assessment: Reviewed RN Assessment  Last BM:  07/09/22  Height:   Ht Readings from Last 1 Encounters:  07/09/22 5' (1.524 m)    Weight:   Wt Readings from Last 1 Encounters:  07/11/22 66.5 kg    Ideal Body Weight:  45.4 kg  BMI:  Body mass index is 28.63 kg/m.  Estimated Nutritional Needs:   Kcal:  1600-1800  Protein:  75-90 grams  Fluid:  > 1.6 L    Levada Schilling, RD, LDN, CDCES Registered Dietitian II Certified Diabetes  Care and Education Specialist Please refer to [facility] for RD and/or RD on-call/weekend/after hours pager

## 2022-07-11 NOTE — Progress Notes (Signed)
Pharmacy Electrolyte Monitoring Consult:  Pharmacy consulted to assist in monitoring and replacing electrolytes in this 36 y.o. female admitted on 07/09/2022 with Respiratory Distress   Labs:  Sodium (mmol/L)  Date Value  07/11/2022 142   Potassium (mmol/L)  Date Value  07/11/2022 3.9   Magnesium (mg/dL)  Date Value  34/37/3578 2.6 (H)   Phosphorus (mg/dL)  Date Value  97/84/7841 <1.0 (LL)   Calcium (mg/dL)  Date Value  28/20/8138 8.6 (L)   Albumin (g/dL)  Date Value  87/19/5974 3.4 (L)    Assessment/Plan: --45 mmol IV potassium phosphate x 1 (contains 66 mEq potassium) per NP --recheck phosphorous 2000 and all other electrolytes in am  Lowella Bandy 07/11/2022 7:35 AM

## 2022-07-11 NOTE — Progress Notes (Signed)
Received patient via bed from [facility], AAOX3,Denies c/o or request at this time. States it has been greater than 4 hours since her breathing treatment. Respiratory therapy called to administer treatment. Watching tv, denies need for drink or food at this time.

## 2022-07-11 NOTE — Progress Notes (Signed)
Right Flank Pain & Hematuria Alerted by nurse the patient is having 10 out of 10 right-sided flank pain.  Patient also questioned why she was having hematuria as she already completed her menstruation a week ago and is on birth control pills for regulation. Upon bedside assessment post 650 mg of Tylenol administration right-sided flank pain has improved from 10 out of 10 to 3 out of 10.  Patient describes it as dull and aching intermittently but at times sharp as if she is having musculoskeletal spasms.  The area is not tender to touch.  Patient confirms she has been dealing with this right-sided flank pain for few weeks now.  She was seen by urgent care and diagnosed with musculoskeletal pain that has not improved with baclofen and naproxen. Reviewed recent lab work, kidney function remained stable and UA not grossly infected. Patient is currently on Rocephin 1 g every 24 for possible pneumonia in the setting of the flu. -Stat CT renal stone study -Added oxycodone 5 mg every 6 for pain control in addition to the Tylenol  Discussed plan of care bedside all questions and concerns answered at this time.   Cheryll Cockayne Rust-Chester, AGACNP-BC Acute Care Nurse Practitioner [facility]   [phone] / [phone] Please see Amion for pager details.

## 2022-07-11 NOTE — Progress Notes (Signed)
NAME:  Monique Roy, MRN:  [id number], DOB:  October 10, 1985, LOS: 2 ADMISSION DATE:  07/09/2022, CONSULTATION DATE:  07/09/22 REFERRING MD:  Dr. Beather Arbour, CHIEF COMPLAINT:     Brief Pt Description / Synopsis:  36 y.o. female admitted with Acute Hypoxic / Hypercapnic Respiratory Failure secondary to Asthma Exacerbation in the setting of Influenza A infection requiring intubation & mechanical ventilation.  History of Present Illness:  36 yo F presenting to [facility] ED from home via EMS, after leaving AMA from the ED on 07/08/22, for evaluation of shortness of breath & cough.  EMS reported hypoxia upon arrival with SpO2 in the 80's on RA. She was placed on a NRB, Duoneb/albuterol x 2 & 125 solumedrol IM.  ED course: Upon arrival patient alert, tachycardic, hypertensive, tachypneic, wheezing with SpO2 in the 50's on RA, patient transitioned to BIPAP support. Unfortunately, she did not tolerate BIPAP due to anxiety. When placed on [place] she became hypoxic in the low 80's. She was administered ativan to help tolerate the BIPAP. Over time the patient became more agitated and still in distress, the decision was made to emergently intubate the patient and provide mechanical ventilatory support. She met SIRS criteria and empiric antibiotic coverage ordered. Labs significant for + Influenza A, leukocytosis, elevated PCT, mildly elevated BNP, mildly elevated Troponin, mild hyperglycemia. CXR clear on imaging, UA pending. Medications given: Rocephin & Azithromycin, ativan, etomidate & succinylcholine, NS 500 mL Bolus, Mag 2 g, duo neb, hydralazine Initial Vitals: 98.8, 25, 110, 200/117 & 99% on BIPAP Significant labs: (Labs/ Imaging personally reviewed) I, Domingo Pulse Rust[place], AGACNP-BC, personally viewed and interpreted this ECG. EKG Interpretation: Date: 07/09/22, EKG Time: 03:59, Rate: 112, Rhythm: ST, QRS Axis:  normal, Intervals: bi-atrial enlargement, ST/T Wave abnormalities: Inferior ST depression, Narrative  Interpretation: ST with inferior ST depression and bi-atrial enlargement Chemistry: Na+: 142, K+: 3.5, BUN/Cr.: 8/0.80, Serum CO2/ AG: 26/10 Hematology: WBC: 25.5, Hgb: 14.3,  Troponin: 22, BNP: 130.6, Lactic/ PCT: 1.4/ 0.48,  COVID-19 & Influenza A/B: Influenza A +  CXR 07/09/22: no acute cardiopulmonary disease   Pt admitted to ICU acute respiratory failure mechanically intubated.   Please see "Significant Hospital Events" section below for full detailed hospital course.  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  Adding Ketamine for both bronchodilating effect and sedation.  Tracheal aspirate with Gram + cocci 12/7: Pt successfully extubated.  Tracheal aspirate with normal respiratory flora.  Will remove central line,A-line, and foley as they are no longer indicated  Interim History / Subjective:  -No significant events noted overnight -Afebrile, hemodynamically stable, no vasopressors -On minimal vent support ~tolerating SBT ~ SUCCESSFULLY EXTUBATED -Tracheal aspirate with normal respiratory flora -Now that pt is extubated, will d/c central & a-lines and foley as no longer indicated   Objective   Blood pressure 137/72, pulse (!) 103, temperature 99.9 F (37.7 C), resp. rate 16, height 5' (1.524 m), weight 66.5 kg, last menstrual period 06/12/2022, SpO2 99  %.  Vent Mode: PRVC FiO2 (%):  [30 %] 30 % Set Rate:  [16 bmp] 16 bmp Vt Set:  [500 mL] 500 mL PEEP:  [5 cmH20] 5 cmH20 Plateau Pressure:  [18 cmH20-19 cmH20] 19 cmH20   Intake/Output Summary (Last 24 hours) at 07/11/2022 0746 Last data filed at 07/11/2022 0355 Gross per 24 hour  Intake 2478.03 ml  Output 927 ml  Net 1551.03 ml    Filed Weights   07/09/22 0442 07/10/22 0402 07/11/22 0420  Weight: 63.5 kg 63.5 kg 66.5 kg    Examination: General: Acute on chronically-ill appearing female, awake on precedex, on vent, in NAD HENT: atraumatic, normocephalic, neck supple, no JVD Lungs: Diminished breath sounds throughout, overbreathes the vent, even, nonlabored  Cardiovascular: RRR,s1s2, no M/G/R, 1+ brachial/1+ distal pulses  Abdomen: +BS x4, obese, soft, non tender, non distended  Extremities: BUE and BLE congenital abnormalities  Neuro: Awake, RASS 0,  following commands, no focal deficits, PERRL GU: Indwelling catheter draining yellow urine   Resolved Hospital Problem list     Assessment & Plan:   Acute Hypoxic / Hypercapnic Respiratory Failure secondary to Asthma Exacerbation in the setting of Influenza A infection PMHx: Asthma -Full vent support, implement lung protective strategies -Plateau pressures less than 30 cm H20 -Wean FiO2 & PEEP as tolerated to maintain O2 sats >90% -Follow intermittent Chest X-ray & ABG as needed -Spontaneous Breathing Trials when respiratory parameters met and mental status permits -Implement VAP Bundle -Bronchodilators & Pulmicort nebs -IV steroids (Solumedrol weaned to 20 mg BID on 12/7) -Discontinue Ketamine gtt 12/7 -ABX as above  Mildly Elevated Troponin secondary to demand ischemia HTN Echocardiogram 07/10/22: LVEF 55-60%, unable to evaluate diastolic parameters, RV function normal -Continuous cardiac monitoring -Maintain MAP >65 -IV fluids -HS Troponin peaked at 39  Influenza A infection Suspected superimposed CAP -Monitor  fever curve -Trend WBC's & Procalcitonin -Follow cultures as above -Continue empiric Azithromycin, Ceftriaxone, and Tamiflu pending cultures & sensitivities  Steroid-induced hyperglycemia  -CBG's ac &hs; Target range of 140 to 180 -SSI -Follow ICU Hypo/Hyperglycemia protocol  Sedation needs in setting of mechanical ventilation -Maintain a RASS goal of 0 to -1  -Precedex as needed to maintain RASS goal -Avoid sedating medications as able -Daily wake up assessment       Best Practice (right click and "Reselect all SmartList Selections" daily)  Diet/type: TF's  DVT prophylaxis: LMWH GI prophylaxis: H2B Lines: Central line and arterial line, will remove 12/7 Foley:  yes, will remove 12/7 Code Status:  full code Last date of multidisciplinary goals of care discussion [07/11/22]  12/7: Pt updated at bedside following extubation.  Labs   CBC: Recent Labs  Lab 07/09/22 0427 07/10/22 0402 07/11/22 0358  WBC 25.5* 15.7* 18.6*  NEUTROABS 22.8*  --   --   HGB 14.3 11.9* 12.4  HCT 45.6 38.8 41.4  MCV 91.0 93.9 93.9  PLT 354 271 305     Basic Metabolic Panel: Recent Labs  Lab 07/09/22 0427 07/10/22 0402 07/10/22 0526 07/11/22 0358  NA 142 140 140 142  K 3.5 5.4* 5.1 3.9  CL 106 108 109 105  CO2 _0 GLUCOSE 180* 144* 147* 150*  BUN _1 CREATININE 0.80 0.74 0.67 0.65  CALCIUM 9.1 8.5* 8.1* 8.6*  MG 2.8* 2.7* 2.6* 2.6*  PHOS  --  1.8* 1.7* <1.0*    GFR: Estimated Creatinine Clearance: 83.5 mL/min (by C-G formula based on SCr of 0.65 mg/dL). Recent Labs  Lab  07/09/22 0427 07/10/22 0402 07/11/22 0358  PROCALCITON 0.48 1.60 0.81  WBC 25.5* 15.7* 18.6*  LATICACIDVEN 1.4  --   --      Liver Function Tests: Recent Labs  Lab 07/09/22 0427 07/11/22 0358  AST 38  --   ALT 29  --   ALKPHOS 55  --   BILITOT 0.7  --   PROT 8.3*  --   ALBUMIN 4.1 3.4*    No results for input(s): "LIPASE", "AMYLASE" in the last 168 hours. No results for  input(s): "AMMONIA" in the last 168 hours.  ABG    Component Value Date/Time   PHART 7.41 07/11/2022 0500   PCO2ART 54 (H) 07/11/2022 0500   PO2ART 110 (H) 07/11/2022 0500   HCO3 34.2 (H) 07/11/2022 0500   ACIDBASEDEF 0.6 07/09/2022 0615   O2SAT 99.9 07/11/2022 0500     Coagulation Profile: No results for input(s): "INR", "PROTIME" in the last 168 hours.  Cardiac Enzymes: No results for input(s): "CKTOTAL", "CKMB", "CKMBINDEX", "TROPONINI" in the last 168 hours.  HbA1C: Hgb A1c MFr Bld  Date/Time Value Ref Range Status  07/09/2022 10:20 AM 5.7 (H) 4.8 - 5.6 % Final    Comment:    (NOTE)         Prediabetes: 5.7 - 6.4         Diabetes: >6.4         Glycemic control for adults with diabetes: <7.0     CBG: Recent Labs  Lab 07/10/22 1224 07/10/22 1459 07/10/22 1942 07/11/22 0024 07/11/22 0404  GLUCAP 162* 151* 122* 208* 143*     Review of Systems:   UTA- intubated and sedated  Past Medical History:  She,  has a past medical history of Asthma, Complication of anesthesia, Congenital anomaly, and Sciatic leg pain.   Surgical History:   Past Surgical History:  Procedure Laterality Date   CESAREAN SECTION     2017     Social History:   reports that she has never smoked. She has never used smokeless tobacco. She reports current alcohol use. She reports current drug use. Drug: Marijuana.   Family History:  Her family history is not on file.   Allergies No Known Allergies   Home Medications  Prior to Admission medications   Medication Sig Start Date End Date Taking? Authorizing Provider  albuterol (VENTOLIN HFA) 108 (90 Base) MCG/ACT inhaler Inhale 1-2 puffs into the lungs every 6 (six) hours as needed for wheezing or shortness of breath. 06/17/21   Rodriguez-Southworth, Sunday Spillers, PA-C  carisoprodol (SOMA) 350 MG tablet Take 1 tablet (350 mg total) by mouth 3 (three) times daily. 07/01/22   Crain, Whitney L, PA  fluticasone (FLOVENT HFA) 110 MCG/ACT inhaler  Inhale into the lungs. 06/23/19 06/21/22  [provider]  Fluticasone-Salmeterol (ADVAIR DISKUS) 100-50 MCG/DOSE AEPB Inhale 1 puff into the lungs 2 (two) times daily. Patient not taking: Reported on 06/21/2022 06/21/19   Marylene Land, NP  Norgestimate-Ethinyl Estradiol Triphasic 0.18/0.215/0.25 MG-25 MCG tab Take 1 tablet by mouth daily. 10/09/21   Junious Dresser, FNP     Critical care time: 40 minutes     Darel Hong, AGACNP-BC [facility] Prefer epic messenger for cross cover needs If after hours, please call E-link

## 2022-07-12 DIAGNOSIS — R319 Hematuria, unspecified: Secondary | ICD-10-CM | POA: Diagnosis not present

## 2022-07-12 DIAGNOSIS — R030 Elevated blood-pressure reading, without diagnosis of hypertension: Secondary | ICD-10-CM | POA: Diagnosis present

## 2022-07-12 DIAGNOSIS — E663 Overweight: Secondary | ICD-10-CM | POA: Diagnosis present

## 2022-07-12 DIAGNOSIS — J101 Influenza due to other identified influenza virus with other respiratory manifestations: Secondary | ICD-10-CM | POA: Diagnosis not present

## 2022-07-12 DIAGNOSIS — J9601 Acute respiratory failure with hypoxia: Secondary | ICD-10-CM | POA: Diagnosis not present

## 2022-07-12 LAB — BLOOD GAS, VENOUS
Acid-base deficit: 0.6 mmol/L (ref 0.0–2.0)
Bicarbonate: 28.1 mmol/L — ABNORMAL HIGH (ref 20.0–28.0)
FIO2: 60 %
O2 Saturation: 100 %
Patient temperature: 37
pCO2, Ven: 64 mmHg — ABNORMAL HIGH (ref 44–60)
pH, Ven: 7.25 (ref 7.25–7.43)
pO2, Ven: 205 mmHg — ABNORMAL HIGH (ref 32–45)

## 2022-07-12 LAB — RENAL FUNCTION PANEL
Albumin: 4 g/dL (ref 3.5–5.0)
Anion gap: 11 (ref 5–15)
BUN: 13 mg/dL (ref 6–20)
CO2: 27 mmol/L (ref 22–32)
Calcium: 9.9 mg/dL (ref 8.9–10.3)
Chloride: 102 mmol/L (ref 98–111)
Creatinine, Ser: 0.79 mg/dL (ref 0.44–1.00)
GFR, Estimated: >60 mL/min (ref >60)
Glucose, Bld: 130 mg/dL — ABNORMAL HIGH (ref 70–99)
Phosphorus: 2.4 mg/dL — ABNORMAL LOW (ref 2.5–4.6)
Potassium: 4.5 mmol/L (ref 3.5–5.1)
Sodium: 140 mmol/L (ref 135–145)

## 2022-07-12 LAB — CBC
HCT: 48.4 % — ABNORMAL HIGH (ref 36.0–46.0)
Hemoglobin: 15.3 g/dL — ABNORMAL HIGH (ref 12.0–15.0)
MCH: 28.3 pg (ref 26.0–34.0)
MCHC: 31.6 g/dL (ref 30.0–36.0)
MCV: 89.6 fL (ref 80.0–100.0)
Platelets: 204 10*3/uL (ref 150–400)
RBC: 5.4 MIL/uL — ABNORMAL HIGH (ref 3.87–5.11)
RDW: 14.1 % (ref 11.5–15.5)
WBC: 11.3 10*3/uL — ABNORMAL HIGH (ref 4.0–10.5)
nRBC: 0 % (ref 0.0–0.2)

## 2022-07-12 LAB — GLUCOSE, CAPILLARY
Glucose-Capillary: 112 mg/dL — ABNORMAL HIGH (ref 70–99)
Glucose-Capillary: 121 mg/dL — ABNORMAL HIGH (ref 70–99)
Glucose-Capillary: 133 mg/dL — ABNORMAL HIGH (ref 70–99)
Glucose-Capillary: 114 mg/dL — ABNORMAL HIGH (ref 70–99)

## 2022-07-12 LAB — MAGNESIUM: Magnesium: 2.5 mg/dL — ABNORMAL HIGH (ref 1.7–2.4)

## 2022-07-12 MED ORDER — PREDNISONE 50 MG PO TABS
50.0000 mg | ORAL_TABLET | Freq: Every day | ORAL | Status: DC
  Administered 2022-07-13: 50 mg via ORAL
  Filled 2022-07-12: qty 1

## 2022-07-12 MED ORDER — AZITHROMYCIN 500 MG PO TABS
500.0000 mg | ORAL_TABLET | Freq: Every day | ORAL | Status: DC
  Administered 2022-07-13: 500 mg via ORAL
  Filled 2022-07-12: qty 1

## 2022-07-12 MED ORDER — STERILE WATER FOR INJECTION IJ SOLN
INTRAMUSCULAR | Status: AC
  Filled 2022-07-12: qty 10

## 2022-07-12 MED ORDER — OSELTAMIVIR PHOSPHATE 75 MG PO CAPS
75.0000 mg | ORAL_CAPSULE | Freq: Two times a day (BID) | ORAL | Status: DC
  Administered 2022-07-12 – 2022-07-13 (×2): 75 mg via ORAL
  Filled 2022-07-12 (×2): qty 1

## 2022-07-12 MED ORDER — ISOSORBIDE MONONITRATE ER 30 MG PO TB24
30.0000 mg | ORAL_TABLET | Freq: Every day | ORAL | Status: DC
  Administered 2022-07-12 – 2022-07-13 (×2): 30 mg via ORAL
  Filled 2022-07-12 (×2): qty 1

## 2022-07-12 MED ORDER — TRAMADOL HCL 50 MG PO TABS: 50.0000 mg | ORAL_TABLET | Freq: Four times a day (QID) | ORAL | Status: DC | PRN

## 2022-07-12 MED ORDER — POTASSIUM & SODIUM PHOSPHATES 280-160-250 MG PO PACK
1.0000 | PACK | Freq: Once | ORAL | Status: AC
  Administered 2022-07-12: 1 via ORAL
  Filled 2022-07-12: qty 1

## 2022-07-12 MED ORDER — CEFDINIR 300 MG PO CAPS
300.0000 mg | ORAL_CAPSULE | Freq: Two times a day (BID) | ORAL | Status: DC
  Administered 2022-07-13: 300 mg via ORAL
  Filled 2022-07-12: qty 1

## 2022-07-12 MED ORDER — GUAIFENESIN-DM 100-10 MG/5ML PO SYRP
5.0000 mL | ORAL_SOLUTION | ORAL | Status: DC | PRN
  Administered 2022-07-12: 5 mL via ORAL
  Filled 2022-07-12: qty 10

## 2022-07-12 MED ORDER — IPRATROPIUM-ALBUTEROL 0.5-2.5 (3) MG/3ML IN SOLN
3.0000 mL | Freq: Two times a day (BID) | RESPIRATORY_TRACT | Status: DC
  Administered 2022-07-12 – 2022-07-13 (×2): 3 mL via RESPIRATORY_TRACT
  Filled 2022-07-12 (×2): qty 3

## 2022-07-12 NOTE — Assessment & Plan Note (Signed)
No previous history of hypertension.  BP persistently elevated during hospitalization, have added some Imdur.

## 2022-07-12 NOTE — Assessment & Plan Note (Addendum)
Improving.  Continue steroids and nebulizers.  Likely set off by influenza A and bronchitis.  Given that this is patient's second hospitalization with intubation, have given her outpatient referral to pulmonary

## 2022-07-12 NOTE — Assessment & Plan Note (Signed)
Patient has reported flank pain for the past 2 to 3 weeks.  Started having hematuria last night.  No previous history of renal stone CT renal protocol normal, however suspect patient has recently passed stone.  Just had.  The week prior, so do not feel this is menses related.  Encouraged hydration and medication for pain.

## 2022-07-12 NOTE — Hospital Course (Signed)
36 year old female with past medical history of asthma as well as congenital anomalies of her upper and lower extremities who presented to the emergency room on 12/4 for shortness of breath for several days and right lower back pain (that have been going on for several weeks).  Patient noted to be hypoxic with oxygen saturations in the mid 80s, felt to be secondary to asthma exacerbation.  Patient also tested positive for influenza A.  Over the next few hours, her breathing decompensated and she was intubated, placed on a ventilator and admitted to the critical care service on the morning of 12/5.  By 12/7, able to be successfully extubated.

## 2022-07-12 NOTE — Assessment & Plan Note (Signed)
Able to be extubated.  Secondary to flu and underlying bronchitis causing asthma exacerbation.  Procalcitonin elevated on admission and so continuing antibiotics as well.  Weaning steroids.  With ambulation, patient's oxygen saturations still dropping below 90%.

## 2022-07-12 NOTE — Assessment & Plan Note (Signed)
Meets criteria with BMI greater than 25 

## 2022-07-12 NOTE — Assessment & Plan Note (Signed)
Continue Tamiflu 

## 2022-07-12 NOTE — Progress Notes (Signed)
[facility] Progress Note  Patient: Monique LabellaJanet Koska    ZOX:[id number]  DOA: 07/09/2022    Date of Service: the patient was seen and examined on 07/12/2022  Brief hospital course: 36 year old female with past medical history of asthma as well as congenital anomalies of her upper and lower extremities who presented to the emergency room on 12/4 for shortness of breath for several days and right lower back pain (that have been going on for several weeks).  Patient noted to be hypoxic with oxygen saturations in the mid 80s, felt to be secondary to asthma exacerbation.  Patient also tested positive for influenza A.  Over the next few hours, her breathing decompensated and she was intubated, placed on a ventilator and admitted to the critical care service on the morning of 12/5.  By 12/7, able to be successfully extubated.   Assessment and Plan: * Acute respiratory failure with hypoxia (HCC) Able to be extubated.  Secondary to flu and underlying bronchitis causing asthma exacerbation.  Procalcitonin elevated on admission and so continuing antibiotics as well.  Weaning steroids.  With ambulation, patient's oxygen saturations still dropping below 90%.  Influenza A Continue Tamiflu  Acute asthma exacerbation Improving.  Continue steroids and nebulizers.  Likely set off by influenza A and bronchitis.  Hematuria Patient has reported flank pain for the past 2 to 3 weeks.  Started having hematuria last night.  No previous history of renal stone CT renal protocol normal, however suspect patient has recently passed stone.  Just had.  The week prior, so do not feel this is menses related.  Encouraged hydration and medication for pain.  Elevated blood pressure reading No previous history of hypertension.  BP persistently elevated during hospitalization, have added some Imdur.  Congenital anomaly of upper limbs Difficult blood draw  Overweight (BMI 25.0-29.9) Meets criteria with BMI greater than  25       Body mass index is 28.03 kg/m.  Nutrition Problem: Inadequate oral intake Etiology: inability to eat (pt sedated and ventilated)     Consultants: Critical care  Procedures: Mechanical intubation 12/5 - 12/7  Antimicrobials: Tamiflu 12/6 - 12/10 Rocephin and Zithromax 12/6 - 12/10  Code Status: Full code   Subjective: Complains of some right flank pain  Objective: Noted mildly elevated blood pressures Vitals:   07/12/22 0900 07/12/22 1232  BP:  (!) 162/85  Pulse:  75  Resp:  17  Temp:  99 F (37.2 C)  SpO2: 93% 96%    Intake/Output Summary (Last 24 hours) at 07/12/2022 1410 Last data filed at 07/11/2022 1628 Gross per 24 hour  Intake 768.52 ml  Output --  Net 768.52 ml   Filed Weights   07/10/22 0402 07/11/22 0420 07/12/22 0500  Weight: 63.5 kg 66.5 kg 65.1 kg   Body mass index is 28.03 kg/m.  Exam:  General: Alert and oriented x 3, no acute distress HEENT: Normocephalic, atraumatic, mucous membranes are moist Cardiovascular: Regular rate and rhythm, S1-S2 Respiratory: Clear to auscultation bilaterally Abdomen: Soft, nontender, nondistended, positive bowel sounds Musculoskeletal: Noted congenital malformation of bilateral upper and lower extremities Skin: No skin breaks, tears or lesions Psychiatry: Appropriate, no evidence of psychoses Neurology: No focal deficits  Data Reviewed: No labs today  Disposition:  Status is: Inpatient Remains inpatient appropriate because:  Continued mild hypoxia with ambulation    Anticipated discharge date: 12/9  Family Communication: Will call sister DVT Prophylaxis: enoxaparin (LOVENOX) injection 40 mg Start: 07/09/22 1400 SCDs Start: 07/09/22 [id number]  Author: Hollice Espy ,MD 07/12/2022 2:10 PM  To reach On-call, see care teams to locate the attending and reach out via [email or web address]. Between 7PM-7AM, please contact night-coverage If you still have difficulty reaching the attending  provider, please page the [facility] (Director on Call) for [facility] on amion for assistance.

## 2022-07-12 NOTE — Evaluation (Signed)
Physical Therapy Evaluation Patient Details Name: Monique Roy MRN: [id number] DOB: Jun 29, 1986 Today's Date: 07/12/2022  History of Present Illness  Serrita Lueth is a 35yoF who [facility] c Rt back pain and SOB, had to leave AMA to pick up her child. Returns next day via EMS still SOB, hypoxic in 80s% SpO2 on RA. Pt (+) influenza, admitted with asthma exacerbation. Pt growing increasingly agistated on higher level so O2, pt ultimately required intubation. At baseline, pt is a single mother of 3 young children under 59, has congential BUE deformity and full yindependent in self care and dependent care. No baseline gait abnormalities or DME used.  Clinical Impression  Pt admitted with above Dx. Pt has functional limitations due to deficits below (see "PT Problem List"). Pt able to provide details on baseline functional status. Today pt requires no assistance to perform basic ADL mobility. She completes session on room air, mild to no SOB, but desaturation to 88% walking. Did not attempt stairs given desat with slow walking. Pt will need to trial 2 flights of stair prior to DC. Pt reports mild instability while up walking, but no gross LOB. Patient's performance this date reveals decreased ability, independence, and tolerance in performing all basic mobility required for performance of activities of daily living. Pt requires additional DME, close physical assistance, and cues for safe participate in mobility. Pt will benefit from skilled PT intervention to increase independence and safety with basic mobility in preparation for discharge to the venue listed below.          Recommendations for follow up therapy are one component of a multi-disciplinary discharge planning process, led by the attending physician.  Recommendations may be updated based on patient status, additional functional criteria and insurance authorization.  Follow Up Recommendations No PT follow up      Assistance  Recommended at Discharge None  Patient can return home with the following  Help with stairs or ramp for entrance    Equipment Recommendations None recommended by PT  Recommendations for Other Services       Functional Status Assessment Patient has had a recent decline in their functional status and demonstrates the ability to make significant improvements in function in a reasonable and predictable amount of time.     Precautions / Restrictions Precautions Precautions: None Precaution Comments: Droplet isolation Restrictions Weight Bearing Restrictions: No      Mobility  Bed Mobility Overal bed mobility: Independent                  Transfers Overall transfer level: Independent Equipment used: None                    Ambulation/Gait Ambulation/Gait assistance: Supervision Gait Distance (Feet): 400 Feet     Gait velocity: SpO2: 88% after 295ft on room air     General Gait Details: slower than typical pacing, endorses some mild unsteadiness compared to baseline, no surpising given course of care and post intubation status. No gross LOB in hall.  Stairs            Wheelchair Mobility    Modified Rankin (Stroke Patients Only)       Balance                                 Standardized Balance Assessment Standardized Balance Assessment : Programmer, systems Test Berg Balance Test Sit to Stand: Able  to stand without using hands and stabilize independently Standing Unsupported: Able to stand safely 2 minutes Sitting with Back Unsupported but Feet Supported on Floor or Stool: Able to sit safely and securely 2 minutes Stand to Sit: Sits safely with minimal use of hands Transfers: Able to transfer safely, minor use of hands Standing Unsupported with Eyes Closed: Able to stand 10 seconds safely Standing Ubsupported with Feet Together: Able to place feet together independently and stand 1 minute safely Turn 360 Degrees: Able to turn 360  degrees safely in 4 seconds or less         Pertinent Vitals/Pain Pain Assessment Pain Assessment: No/denies pain    Home Living Family/patient expects to be discharged to:: Private residence Living Arrangements: Children (3 young kids, cousine live nearby and asssit with family needs) Available Help at Discharge: Family Type of Home: Apartment Home Access: Stairs to enter Entrance Stairs-Rails: Doctor, general practice of Steps: third floor apartment, "2 flights of saitrs" 32 stairs per younger son   Home Layout: One level Home Equipment: None      Prior Function Prior Level of Function : Independent/Modified Independent;Driving             Mobility Comments: fully independent ADLs Comments: fully independent     Hand Dominance        Extremity/Trunk Assessment   Upper Extremity Assessment Upper Extremity Assessment:  (BUE congenital limb deformity, has only 1 finger on RUE; functional for ADL performance, IADL; brachial and antebrachial regions estimated to be ~30% of anticipated limb length and girth.)            Communication      Cognition Arousal/Alertness: Awake/alert Behavior During Therapy: WFL for tasks assessed/performed Overall Cognitive Status: Within Functional Limits for tasks assessed                                          General Comments      Exercises     Assessment/Plan    PT Assessment Patient needs continued PT services  PT Problem List Decreased activity tolerance;Decreased balance;Cardiopulmonary status limiting activity       PT Treatment Interventions Stair training;Therapeutic activities;Therapeutic exercise;Balance training;Functional mobility training    PT Goals (Current goals can be found in the Care Plan section)  Acute Rehab PT Goals Patient Stated Goal: regain balance in gait, tolerate entry stairs PT Goal Formulation: With patient Time For Goal Achievement: 07/26/22 Potential to  Achieve Goals: Good    Frequency Min 2X/week     Co-evaluation               AM-PAC PT "6 Clicks" Mobility  Outcome Measure Help needed turning from your back to your side while in a flat bed without using bedrails?: None Help needed moving from lying on your back to sitting on the side of a flat bed without using bedrails?: None Help needed moving to and from a bed to a chair (including a wheelchair)?: None Help needed standing up from a chair using your arms (e.g., wheelchair or bedside chair)?: None Help needed to walk in hospital room?: None Help needed climbing 3-5 steps with a railing? : A Little 6 Click Score: 23    End of Session Equipment Utilized During Treatment: Gait belt Activity Tolerance: Patient tolerated treatment well;Patient limited by fatigue;Treatment limited secondary to medical complications (Comment) Patient left: with call bell/phone within  reach;in bed Nurse Communication: Mobility status PT Visit Diagnosis: Unsteadiness on feet (R26.81);Other abnormalities of gait and mobility (R26.89)    Time: 6222-9798 PT Time Calculation (min) (ACUTE ONLY): 23 min   Charges:   PT Evaluation $PT Eval Low Complexity: 1 Low PT Treatments $Therapeutic Activity: 8-22 mins       9:33 AM, 07/12/22 Rosamaria Lints, PT, DPT Physical Therapist - [facility]  [phone] (ASCOM)    Maelynn Moroney C 07/12/2022, 9:26 AM

## 2022-07-12 NOTE — Care Management Important Message (Signed)
Important Message  Patient Details  Name: Monique Roy MRN: [id number] Date of Birth: [birth year]   Medicare Important Message Given:  N/A - LOS <3 / Initial given by admissions     [name] 07/12/2022, 11:32 AM

## 2022-07-12 NOTE — Assessment & Plan Note (Signed)
Difficult blood draw

## 2022-07-12 NOTE — Progress Notes (Signed)
Pharmacy Electrolyte Monitoring Consult:  Pharmacy consulted to assist in monitoring and replacing electrolytes in this 36 y.o. female admitted on 07/09/2022 with Respiratory Distress   Labs:  Sodium (mmol/L)  Date Value  07/12/2022 140   Potassium (mmol/L)  Date Value  07/12/2022 4.5   Magnesium (mg/dL)  Date Value  69/45/0388 2.5 (H)   Phosphorus (mg/dL)  Date Value  82/80/0349 2.4 (L)   Calcium (mg/dL)  Date Value  17/91/5056 9.9   Albumin (g/dL)  Date Value  97/94/8016 4.0    Assessment/Plan: Phos-Nak one packet po x 1 ( contains 250 mg elemental phosphorus) Because this consult was generated as part of an ICU order set and patient is transferring pharmacy will sign off for now Please feel free to reach out if any further assistance is needed  Lowella Bandy 07/12/2022 3:47 PM

## 2022-07-13 DIAGNOSIS — J9601 Acute respiratory failure with hypoxia: Secondary | ICD-10-CM | POA: Diagnosis not present

## 2022-07-13 DIAGNOSIS — R030 Elevated blood-pressure reading, without diagnosis of hypertension: Secondary | ICD-10-CM | POA: Diagnosis not present

## 2022-07-13 DIAGNOSIS — J101 Influenza due to other identified influenza virus with other respiratory manifestations: Secondary | ICD-10-CM | POA: Diagnosis not present

## 2022-07-13 DIAGNOSIS — J45901 Unspecified asthma with (acute) exacerbation: Secondary | ICD-10-CM | POA: Diagnosis not present

## 2022-07-13 LAB — PROCALCITONIN: Procalcitonin: 0.24 ng/mL

## 2022-07-13 LAB — GLUCOSE, CAPILLARY: Glucose-Capillary: 154 mg/dL — ABNORMAL HIGH (ref 70–99)

## 2022-07-13 MED ORDER — CEFDINIR 300 MG PO CAPS: 300.0000 mg | ORAL_CAPSULE | Freq: Two times a day (BID) | ORAL | 0 refills | Status: DC

## 2022-07-13 MED ORDER — PREDNISONE 10 MG PO TABS: ORAL_TABLET | ORAL | 0 refills | Status: AC

## 2022-07-13 MED ORDER — AZITHROMYCIN 500 MG PO TABS: 500.0000 mg | ORAL_TABLET | Freq: Once | ORAL | 0 refills | Status: AC

## 2022-07-13 MED ORDER — TRAMADOL HCL 50 MG PO TABS: 50.0000 mg | ORAL_TABLET | Freq: Four times a day (QID) | ORAL | 0 refills | Status: DC | PRN

## 2022-07-13 MED ORDER — ENSURE ENLIVE PO LIQD: 237.0000 mL | Freq: Two times a day (BID) | ORAL | 12 refills | Status: AC

## 2022-07-13 NOTE — Plan of Care (Signed)
Problem: Education: Goal: Knowledge of disease or condition will improve 07/13/2022 0023 by Sharen Hones, RN Outcome: Progressing 07/13/2022 0022 by Sharen Hones, RN Outcome: Progressing Goal: Knowledge of the prescribed therapeutic regimen will improve 07/13/2022 0023 by Sharen Hones, RN Outcome: Progressing 07/13/2022 0022 by Sharen Hones, RN Outcome: Progressing Goal: Individualized Educational Video(s) 07/13/2022 0023 by Sharen Hones, RN Outcome: Progressing 07/13/2022 0022 by Sharen Hones, RN Outcome: Progressing   Problem: Activity: Goal: Ability to tolerate increased activity will improve 07/13/2022 0023 by Sharen Hones, RN Outcome: Progressing 07/13/2022 0022 by Sharen Hones, RN Outcome: Progressing Goal: Will verbalize the importance of balancing activity with adequate rest periods 07/13/2022 0023 by Sharen Hones, RN Outcome: Progressing 07/13/2022 0022 by Sharen Hones, RN Outcome: Progressing   Problem: Respiratory: Goal: Ability to maintain a clear airway will improve 07/13/2022 0023 by Sharen Hones, RN Outcome: Progressing 07/13/2022 0022 by Sharen Hones, RN Outcome: Progressing Goal: Levels of oxygenation will improve 07/13/2022 0023 by Sharen Hones, RN Outcome: Progressing 07/13/2022 0022 by Sharen Hones, RN Outcome: Progressing Goal: Ability to maintain adequate ventilation will improve 07/13/2022 0023 by Sharen Hones, RN Outcome: Progressing 07/13/2022 0022 by Sharen Hones, RN Outcome: Progressing   Problem: Education: Goal: Ability to describe self-care measures that may prevent or decrease complications (Diabetes Survival Skills Education) will improve 07/13/2022 0023 by Sharen Hones, RN Outcome: Progressing 07/13/2022 0022 by Sharen Hones, RN Outcome: Progressing Goal: Individualized Educational Video(s) 07/13/2022 0023 by Sharen Hones, RN Outcome: Progressing 07/13/2022 0022 by  Sharen Hones, RN Outcome: Progressing   Problem: Coping: Goal: Ability to adjust to condition or change in health will improve 07/13/2022 0023 by Sharen Hones, RN Outcome: Progressing 07/13/2022 0022 by Sharen Hones, RN Outcome: Progressing   Problem: Fluid Volume: Goal: Ability to maintain a balanced intake and output will improve 07/13/2022 0023 by Sharen Hones, RN Outcome: Progressing 07/13/2022 0022 by Sharen Hones, RN Outcome: Progressing   Problem: Health Behavior/Discharge Planning: Goal: Ability to identify and utilize available resources and services will improve 07/13/2022 0023 by Sharen Hones, RN Outcome: Progressing 07/13/2022 0022 by Sharen Hones, RN Outcome: Progressing Goal: Ability to manage health-related needs will improve 07/13/2022 0023 by Sharen Hones, RN Outcome: Progressing 07/13/2022 0022 by Sharen Hones, RN Outcome: Progressing   Problem: Metabolic: Goal: Ability to maintain appropriate glucose levels will improve 07/13/2022 0023 by Sharen Hones, RN Outcome: Progressing 07/13/2022 0022 by Sharen Hones, RN Outcome: Progressing   Problem: Nutritional: Goal: Maintenance of adequate nutrition will improve 07/13/2022 0023 by Sharen Hones, RN Outcome: Progressing 07/13/2022 0022 by Sharen Hones, RN Outcome: Progressing Goal: Progress toward achieving an optimal weight will improve 07/13/2022 0023 by Sharen Hones, RN Outcome: Progressing 07/13/2022 0022 by Sharen Hones, RN Outcome: Progressing   Problem: Skin Integrity: Goal: Risk for impaired skin integrity will decrease 07/13/2022 0023 by Sharen Hones, RN Outcome: Progressing 07/13/2022 0022 by Sharen Hones, RN Outcome: Progressing   Problem: Tissue Perfusion: Goal: Adequacy of tissue perfusion will improve 07/13/2022 0023 by Sharen Hones, RN Outcome: Progressing 07/13/2022 0022 by Sharen Hones, RN Outcome: Progressing    Problem: Education: Goal: Knowledge of General Education information will improve Description: Including pain rating scale, medication(s)/side effects and non-pharmacologic comfort measures 07/13/2022 0023 by Sharen Hones, RN Outcome: Progressing 07/13/2022 0022 by Sharen Hones, RN Outcome: Progressing   Problem: Health Behavior/Discharge Planning: Goal: Ability to manage health-related needs will improve 07/13/2022 0023 by Sharen Hones, RN Outcome: Progressing 07/13/2022 0022 by Sharen Hones, RN Outcome: Progressing   Problem: Clinical Measurements: Goal: Ability to maintain  clinical measurements within normal limits will improve 07/13/2022 0023 by Sharen Hones, RN Outcome: Progressing 07/13/2022 0022 by Sharen Hones, RN Outcome: Progressing Goal: Will remain free from infection 07/13/2022 0023 by Sharen Hones, RN Outcome: Progressing 07/13/2022 0022 by Sharen Hones, RN Outcome: Progressing Goal: Diagnostic test results will improve 07/13/2022 0023 by Sharen Hones, RN Outcome: Progressing 07/13/2022 0022 by Sharen Hones, RN Outcome: Progressing Goal: Respiratory complications will improve 07/13/2022 0023 by Sharen Hones, RN Outcome: Progressing 07/13/2022 0022 by Sharen Hones, RN Outcome: Progressing Goal: Cardiovascular complication will be avoided 07/13/2022 0023 by Sharen Hones, RN Outcome: Progressing 07/13/2022 0022 by Sharen Hones, RN Outcome: Progressing   Problem: Activity: Goal: Risk for activity intolerance will decrease 07/13/2022 0023 by Sharen Hones, RN Outcome: Progressing 07/13/2022 0022 by Sharen Hones, RN Outcome: Progressing   Problem: Nutrition: Goal: Adequate nutrition will be maintained 07/13/2022 0023 by Sharen Hones, RN Outcome: Progressing 07/13/2022 0022 by Sharen Hones, RN Outcome: Progressing   Problem: Coping: Goal: Level of anxiety will decrease 07/13/2022 0023 by Sharen Hones, RN Outcome: Progressing 07/13/2022 0022 by Sharen Hones, RN Outcome: Progressing   Problem: Elimination: Goal: Will not experience complications related to bowel motility 07/13/2022 0023 by Sharen Hones, RN Outcome: Progressing 07/13/2022 0022 by Sharen Hones, RN Outcome: Progressing Goal: Will not experience complications related to urinary retention 07/13/2022 0023 by Sharen Hones, RN Outcome: Progressing 07/13/2022 0022 by Sharen Hones, RN Outcome: Progressing   Problem: Pain Managment: Goal: General experience of comfort will improve 07/13/2022 0023 by Sharen Hones, RN Outcome: Progressing 07/13/2022 0022 by Sharen Hones, RN Outcome: Progressing   Problem: Safety: Goal: Ability to remain free from injury will improve 07/13/2022 0023 by Sharen Hones, RN Outcome: Progressing 07/13/2022 0022 by Sharen Hones, RN Outcome: Progressing   Problem: Skin Integrity: Goal: Risk for impaired skin integrity will decrease 07/13/2022 0023 by Sharen Hones, RN Outcome: Progressing 07/13/2022 0022 by Sharen Hones, RN Outcome: Progressing

## 2022-07-13 NOTE — Plan of Care (Signed)

## 2022-07-13 NOTE — Discharge Summary (Addendum)
Physician Discharge Summary   Patient: Monique Roy MRN: [id number] DOB: 03/06/1986  Admit date:     07/09/2022  Discharge date: 07/13/22  Discharge Physician: Annita Brod   PCP: [facility]   Recommendations at discharge:   New medication: Prednisone taper Patient given referral to follow-up with pulmonary New medication: Omnicef 300 mg every 12 hours x 2 more days New medication: Zithromax 500 mg p.o. daily x 1 additional dose on 12/9 New medication: Ultram 50 mg p.o. 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  Patient noted to be hypoxic with oxygen saturations in the mid 80s, felt to be secondary to asthma exacerbation.  Patient also tested positive for influenza A.  Over the next few hours, her breathing decompensated and she was intubated, placed on a ventilator and admitted to the critical care service on the morning of 12/5.  By 12/7, able to be successfully extubated.  Assessment and Plan: * Acute respiratory failure with hypoxia (HCC)-resolved as of 07/13/2022 Able to be extubated.  Secondary to flu and underlying bronchitis causing asthma exacerbation.  Procalcitonin elevated on admission and so continuing antibiotics as well.  By 12/9, able to ambulate well without any hypoxia or shortness of breath.  Influenza A Completed tamiflu  Acute asthma exacerbation-resolved as of  07/13/2022 Improving.  Continue steroids and nebulizers.  Likely set off by influenza A and bronchitis.  Given that this is patient's second hospitalization with intubation, have given her outpatient referral to pulmonary  Hematuria-resolved as of 07/13/2022 Patient has reported flank pain for the past 2 to 3 weeks.  Started having hematuria last night.  No previous history of renal stone CT renal protocol normal, however suspect patient has recently passed renal stone.  Just had.  The week prior, so do not feel this is menses related.  Encouraged hydration and medication for pain.  Some residual pain, put on Ultram.  No further hematuria.  Elevated blood pressure reading-resolved as of 07/13/2022 No previous history of hypertension.  BP persistently elevated during hospitalization, gave trial of Imdur.  However, once flank pain much improved, blood pressure soft, so no need for Imdur on discharge  Congenital anomaly of upper limbs Difficult blood draw  Overweight (BMI 25.0-29.9) Meets criteria with BMI greater than 25        Pain control - [place] Controlled Substance Reporting System database was reviewed. and patient was instructed, not to drive, operate heavy machinery, perform activities at heights, swimming or participation in water activities or provide baby-sitting services while on Pain, Sleep and Anxiety Medications; until their outpatient Physician has advised to do so again. Also recommended to not to take more than prescribed Pain, Sleep and Anxiety Medications.  Consultants: Critical care Procedures performed: Mechanical intubation Disposition: Home Diet recommendation:  Discharge Diet Orders (From admission, onward)     Start     Ordered   07/13/22 0000  Diet -  low sodium heart healthy        07/13/22 1232           Low-sodium diet DISCHARGE MEDICATION: Allergies as of 07/13/2022   No Known Allergies      Medication List     TAKE these medications     albuterol 108 (90 Base) MCG/ACT inhaler Commonly known as: VENTOLIN HFA Inhale 1-2 puffs into the lungs every 6 (six) hours as needed for wheezing or shortness of breath.   azithromycin 500 MG tablet Commonly known as: ZITHROMAX Take 1 tablet (500 mg total) by mouth once for 1 dose. Start taking on: July 14, 2022   carisoprodol 350 MG tablet Commonly known as: SOMA Take 1 tablet (350 mg total) by mouth 3 (three) times daily.   cefdinir 300 MG capsule Commonly known as: OMNICEF Take 1 capsule (300 mg total) by mouth every 12 (twelve) hours.   feeding supplement Liqd Take 237 mLs by mouth 2 (two) times daily between meals.   fluticasone 110 MCG/ACT inhaler Commonly known as: FLOVENT HFA Inhale into the lungs.   Norgestimate-Ethinyl Estradiol Triphasic 0.18/0.215/0.25 MG-25 MCG tab Take 1 tablet by mouth daily.   predniSONE 10 MG tablet Commonly known as: DELTASONE Take 4 tablets (40 mg total) by mouth daily with breakfast for 1 day, THEN 3 tablets (30 mg total) daily with breakfast for 1 day, THEN 2 tablets (20 mg total) daily with breakfast for 1 day, THEN 1 tablet (10 mg total) daily with breakfast for 1 day. Start taking on: July 14, 2022   traMADol 50 MG tablet Commonly known as: ULTRAM Take 1 tablet (50 mg total) by mouth every 6 (six) hours as needed for moderate pain.        Follow-up Information     Ottie Glazier, MD. Schedule an appointment as soon as possible for a visit in 1 month(s).   Specialty: Pulmonary Disease Contact information: [place] [address] [phone]                Discharge Exam: Monique Roy Weights   07/10/22 0402 07/11/22 0420 07/12/22 0500  Weight: 63.5 kg 66.5 kg 65.1 kg   General: Alert and oriented x 3, no acute distress Lungs: Clear to auscultation bilaterally Cardiovascular: Regular rate and rhythm, S1-S2  Condition at discharge: good  The results of significant diagnostics from this  hospitalization (including imaging, microbiology, ancillary and laboratory) are listed below for reference.   Imaging Studies: CT RENAL STONE STUDY  Result Date: 07/12/2022 CLINICAL DATA:  Abdominal pain.  Concern for kidney stone. EXAM: CT ABDOMEN AND PELVIS WITHOUT CONTRAST TECHNIQUE: Multidetector CT imaging of the abdomen and pelvis was performed following the standard protocol without IV contrast. RADIATION DOSE REDUCTION: This exam was performed according to the departmental dose-optimization program which includes automated exposure control, adjustment of the mA and/or kV according to patient size and/or use of iterative reconstruction technique. COMPARISON:  CT abdomen pelvis dated 07/23/2017. FINDINGS: Evaluation of this exam is limited in the absence of intravenous contrast. Lower chest: Minimal bibasilar nodularity may be sequela of prior inflammatory/infectious process. Developing infiltrate is not excluded clinical correlation is recommended. No intra-abdominal free air.  Trace free fluid within the pelvis. Hepatobiliary: No focal liver abnormality is seen. No gallstones, gallbladder wall thickening, or biliary dilatation. Pancreas: Unremarkable. No pancreatic ductal dilatation or surrounding inflammatory changes. Spleen: Normal in size without focal abnormality. Adrenals/Urinary Tract: The adrenal glands are unremarkable. There is no hydronephrosis or nephrolithiasis on  either side. The visualized ureters and urinary bladder appear unremarkable. Small pocket of air within the bladder may have been introduced via recent instrumentation. Correlation with urinalysis recommended to exclude cystitis. Stomach/Bowel: There is no bowel obstruction or active inflammation. The appendix is normal. Vascular/Lymphatic: The abdominal aorta and IVC are grossly unremarkable on this noncontrast CT. No portal venous gas. There is no adenopathy. Reproductive: The uterus and ovaries are grossly unremarkable. No  adnexal masses. Other: None Musculoskeletal: Lower lumbar disc desiccation. No acute osseous pathology. Small pockets of subcutaneous air in the bilateral abdomen of indeterminate etiology and may be related to subcutaneous injections. Clinical correlation is recommended. IMPRESSION: 1. No hydronephrosis or nephrolithiasis. 2. No bowel obstruction. Normal appendix. 3. Minimal bibasilar nodularity may be sequela of prior inflammatory/infectious process. Developing infiltrate is not excluded. Clinical correlation is recommended. Electronically Signed   By: Elgie Collard M.D.   On: 07/12/2022 00:10   DG Chest Port 1 View  Result Date: 07/11/2022 CLINICAL DATA:  Acute respiratory failure in the setting of influenza EXAM: PORTABLE CHEST 1 VIEW COMPARISON:  Chest radiograph dated 07/09/2022 FINDINGS: Lines/tubes: Enteric tube tip projects over the 4.2 cm above the carina. Left internal jugular venous catheter tip projects over the SVC, unchanged. Enteric tube tip reaches the diaphragm and terminates below the field of view. The side hole projects over the gastric cardia. Chest: Lungs are clear without focal consolidation. Pleura: No pneumothorax or pleural effusion. Heart/mediastinum: Similar  cardiomediastinal silhouette. Bones: No acute osseous abnormality. IMPRESSION: 1. Enteric tube tip projects over the diaphragm and terminates below the field of view. 2. Left internal jugular venous catheter tip projects over the SVC, unchanged. Electronically Signed   By: Agustin Cree M.D.   On: 07/11/2022 08:49   ECHOCARDIOGRAM COMPLETE  Result Date: 07/10/2022    ECHOCARDIOGRAM REPORT   Patient Name:   LAVONA NORSWORTHY Date of Exam: 07/10/2022 Medical Rec #:  [id number]   Height:       60.0 in Accession #:    [id number]  Weight:       140.0 lb Date of Birth:  1986-05-11   BSA:          1.604 m Patient Age:    35 years    BP:           118/63 mmHg Patient Gender: F           HR:           101 bpm. Exam Location:  [facility] Procedure:  2D Echo, Cardiac Doppler and Color Doppler Indications:     Acute respiratory distress R06.03  History:         Patient has no prior history of Echocardiogram examinations. No                  cardiac history in chart.  Sonographer:     Cristela Blue Referring Phys:  [id number] Ezequiel Essex Diagnosing Phys: Debbe Odea MD  Sonographer Comments: Technically challenging study due to limited acoustic windows and echo performed with patient supine and on artificial respirator. The only view obtainable was subcostal. IMPRESSIONS  1. Left ventricular ejection fraction, by estimation, is 55 to 60%. The left ventricle has normal function. The left ventricle has no regional wall motion abnormalities. Left ventricular diastolic function could not be evaluated.  2. Right ventricular systolic function is normal. The right ventricular size is normal.  3. The mitral valve is normal in structure. No evidence of mitral valve regurgitation.  4. The aortic valve was not well visualized. Aortic valve regurgitation is not visualized. FINDINGS  Left Ventricle: Left ventricular ejection fraction, by estimation, is 55 to 60%. The left ventricle has normal function. The left ventricle has no regional wall motion abnormalities. The left ventricular internal cavity size was normal in size. There is  no left ventricular hypertrophy. Left ventricular diastolic function could not be evaluated. Right Ventricle: The right ventricular size is normal. No increase in right ventricular wall thickness. Right ventricular systolic function is normal. Left Atrium: Left atrial size was normal in size. Right Atrium: Right atrial size was not well visualized. Pericardium: There is no evidence of pericardial effusion. Mitral Valve: The mitral valve is normal in structure. No evidence of mitral valve regurgitation. Tricuspid Valve: The tricuspid valve is not well visualized. Tricuspid valve regurgitation is not demonstrated. Aortic Valve: The aortic valve  was not well visualized. Aortic valve regurgitation is not visualized. Pulmonic Valve: The pulmonic valve was not well visualized. Pulmonic valve regurgitation is not visualized. Aorta: The aortic root was not well visualized. IAS/Shunts: No atrial level shunt detected by color flow Doppler.  LEFT VENTRICLE PLAX 2D LVIDd:         3.40 cm LVIDs:         2.50 cm LV PW:         0.90 cm LV IVS:        1.00 cm  LEFT ATRIUM         Index LA diam:    3.40 cm 2.12 cm/m Debbe Odea MD Electronically signed by Debbe Odea MD Signature Date/Time: 07/10/2022/7:25:02 PM    Final    DG Chest Port 1 View  Result Date: 07/09/2022 CLINICAL DATA:  Central line placement. EXAM: PORTABLE CHEST 1 VIEW COMPARISON:  Earlier film, same date. FINDINGS: The endotracheal tube and NG tubes are in good position, unchanged. The left IJ central venous catheter tip is at the brachiocephalic SVC junction. The cardiac silhouette, mediastinal and hilar contours are normal. The lungs are clear. IMPRESSION: 1. Left IJ central venous catheter tip is at the brachiocephalic SVC junction. 2. No acute cardiopulmonary findings. Electronically Signed   By: Rudie Meyer M.D.   On: 07/09/2022 13:09   DG Chest Port 1 View  Result Date: 07/09/2022 CLINICAL DATA:  Central line placement EXAM: PORTABLE CHEST 1 VIEW COMPARISON:  07/09/2022 at 1007 hours FINDINGS: Central venous line from LEFT-sided approach with tip midline at the level of the aortic arch. Presumably line is in the brachycephalic vein proximally. The course of line appears similar to radiograph earlier same day but retracted by approximately 5 cm. No pneumothorax. Endotracheal tube and NG tube unchanged. Lungs are clear. IMPRESSION: 1. LEFT central venous line retracted to the mid/proximal brachycephalic vein. 2. No pneumothorax. Electronically Signed   By: Genevive Bi M.D.   On: 07/09/2022 11:49   DG Abd 1 View  Result Date: 07/09/2022 CLINICAL DATA:  OG tube placement  EXAM: ABDOMEN - 1 VIEW COMPARISON:  None Available. FINDINGS: OG tube tip and side port are in the stomach. Visualized lung bases are clear. Nonobstructive bowel-gas pattern. IMPRESSION: OG tube tip and side port are in the stomach. Electronically Signed   By: Allegra Lai M.D.   On: 07/09/2022 10:27   DG Chest Port 1 View  Result Date: 07/09/2022 CLINICAL DATA:  Central line placement EXAM: PORTABLE CHEST 1 VIEW COMPARISON:  Chest x-ray dated July 09, 2022 obtained at 6:39 a.m. FINDINGS: Interval placement of  left IJ line catheter, distal aspect of the line takes a superior course with tip likely within the right brachiocephalic vein. Unchanged position of OG tube. Lungs are clear. No evidence of pleural effusion or pneumothorax, although lung apices are excluded from the field of view. Cardiac and mediastinal contours are within normal limits. IMPRESSION: Interval placement of left IJ line catheter, distal aspect of the line takes a superior course with tip likely within the right brachiocephalic vein. Recommend repositioning. These results will be called to the ordering clinician or representative by the Radiologist Assistant, and communication documented in the PACS or [employer]. Electronically Signed   By: Yetta Glassman M.D.   On: 07/09/2022 10:26   DG Chest Portable 1 View  Result Date: 07/09/2022 CLINICAL DATA:  Status post intubation. EXAM: PORTABLE CHEST 1 VIEW COMPARISON:  07/09/2022 FINDINGS: 0639 hours. Lungs are hyperexpanded. Endotracheal tube tip is 8.2 cm above the base of the carina, positioned at the level of the sternal notch. The NG tube passes into the stomach although the distal tip position is not included on the film. The lungs are clear without focal pneumonia, edema, pneumothorax or pleural effusion. The cardiopericardial silhouette is within normal limits for size. The visualized bony structures of the thorax are unremarkable. Telemetry leads overlie the chest.  IMPRESSION: Endotracheal tube tip is 8.2 cm above the base of the carina, positioned at the level of the sternal notch. Lungs are hyperexpanded without acute cardiopulmonary findings. Electronically Signed   By: Misty Stanley M.D.   On: 07/09/2022 06:57   DG Chest Port 1 View  Result Date: 07/09/2022 CLINICAL DATA:  Dyspnea EXAM: PORTABLE CHEST 1 VIEW COMPARISON:  07/08/2022 FINDINGS: The heart size and mediastinal contours are within normal limits. Both lungs are clear. The visualized skeletal structures are unremarkable. IMPRESSION: No active disease. Electronically Signed   By: Fidela Salisbury M.D.   On: 07/09/2022 04:20   DG Chest 2 View  Result Date: 07/08/2022 CLINICAL DATA:  Pt complains of right sided back pain and shob for a month. States went to [facility] but wants a third opinion. States only [facility] when has a cold and has a cold right now. EXAM: CHEST - 2 VIEW COMPARISON:  06/17/2021 FINDINGS: Lungs are clear. Heart size and mediastinal contours are within normal limits. No effusion. Visualized bones unremarkable. IMPRESSION: No acute cardiopulmonary disease. Electronically Signed   By: Lucrezia Europe M.D.   On: 07/08/2022 14:03    Microbiology: Results for orders placed or performed during the hospital encounter of 07/09/22  Culture, blood (routine x 2)     Status: None (Preliminary result)   Collection Time: 07/09/22  4:27 AM   Specimen: BLOOD  Result Value Ref Range Status   Specimen Description BLOOD RIGHT UPPER ARM  Final   Special Requests   Final    BOTTLES DRAWN AEROBIC AND ANAEROBIC Blood Culture results may not be optimal due to an inadequate volume of blood received in culture bottles   Culture   Final    NO GROWTH 4 DAYS Performed at [facility], [address], [place], [place] [address]    Report Status PENDING  Incomplete  Resp Panel by RT-PCR (Flu A&B, Covid) Anterior Nasal Swab     Status: Abnormal   Collection Time: 07/09/22  4:27 AM   Specimen: Anterior Nasal  Swab  Result Value Ref Range Status   SARS Coronavirus 2 by RT PCR NEGATIVE NEGATIVE Final    Comment: (NOTE) SARS-CoV-2 target  nucleic acids are NOT DETECTED.  The SARS-CoV-2 RNA is generally detectable in upper respiratory specimens during the acute phase of infection. The lowest concentration of SARS-CoV-2 viral copies this assay can detect is 138 copies/mL. A negative result does not preclude SARS-Cov-2 infection and should not be used as the sole basis for treatment or other patient management decisions. A negative result may occur with  improper specimen collection/handling, submission of specimen other than nasopharyngeal swab, presence of viral mutation(s) within the areas targeted by this assay, and inadequate number of viral copies(<138 copies/mL). A negative result must be combined with clinical observations, patient history, and epidemiological information. The expected result is Negative.  Fact Sheet for Patients:  EntrepreneurPulse.com.au  Fact Sheet for Healthcare Providers:  IncredibleEmployment.be  This test is no t yet approved or cleared by the [place] FDA and  has been authorized for detection and/or diagnosis of SARS-CoV-2 by FDA under an Emergency Use Authorization (EUA). This EUA will remain  in effect (meaning this test can be used) for the duration of the COVID-19 declaration under Section 564(b)(1) of the Act, 21 U.S.C.section 360bbb-3(b)(1), unless the authorization is terminated  or revoked sooner.       Influenza A by PCR POSITIVE (A) NEGATIVE Final   Influenza B by PCR NEGATIVE NEGATIVE Final    Comment: (NOTE) The Xpert Xpress SARS-CoV-2/FLU/RSV plus assay is intended as an aid in the diagnosis of influenza from Nasopharyngeal swab specimens and should not be used as a sole basis for treatment. Nasal washings and aspirates are unacceptable for Xpert Xpress SARS-CoV-2/FLU/RSV testing.  Fact Sheet for  Patients: EntrepreneurPulse.com.au  Fact Sheet for Healthcare Providers: IncredibleEmployment.be  This test is not yet approved or cleared by the [place] FDA and has been authorized for detection and/or diagnosis of SARS-CoV-2 by FDA under an Emergency Use Authorization (EUA). This EUA will remain in effect (meaning this test can be used) for the duration of the COVID-19 declaration under Section 564(b)(1) of the Act, 21 U.S.C. section 360bbb-3(b)(1), unless the authorization is terminated or revoked.  Performed at [facility], [address] [place], [place] [address]   Urine Culture     Status: None   Collection Time: 07/09/22  7:53 AM   Specimen: Urine, Clean Catch  Result Value Ref Range Status   Specimen Description   Final    URINE, CLEAN CATCH Performed at [facility], [address] [place], [place] [address]    Special Requests   Final    NONE Performed at [facility], [address] [place], [place] [address]    Culture   Final    NO GROWTH Performed at [facility], [place] [address], [place], [place] [address]    Report Status 07/10/2022 FINAL  Final  MRSA Next Gen by PCR, Nasal     Status: None   Collection Time: 07/09/22  9:11 AM   Specimen: Nasal Mucosa; Nasal Swab  Result Value Ref Range Status   MRSA by PCR Next Gen NOT DETECTED NOT DETECTED Final    Comment: (NOTE) The GeneXpert MRSA Assay (FDA approved for NASAL specimens only), is one component of a comprehensive MRSA colonization surveillance program. It is not intended to diagnose MRSA infection nor to guide or monitor treatment for MRSA infections. Test performance is not FDA approved in patients less than 47 years old. Performed at [facility], [address] [place], [place] [address]   Culture, Respiratory w Gram Stain  Status: None   Collection Time: 07/09/22 10:20 AM   Specimen: Tracheal  Aspirate; Respiratory  Result Value Ref Range Status   Specimen Description   Final    TRACHEAL ASPIRATE Performed at [facility], [address], [place], [place] [address]    Special Requests   Final    NONE Performed at [facility], [address] [place], [place] [address]    Gram Stain   Final    FEW WBC PRESENT,BOTH PMN AND MONONUCLEAR RARE GRAM POSITIVE COCCI IN PAIRS    Culture   Final    MODERATE Normal respiratory flora-no Staph aureus or Pseudomonas seen Performed at [facility], [place] [address] [place], [place] [address]    Report Status 07/11/2022 FINAL  Final    Labs: CBC: Recent Labs  Lab 07/09/22 0427 07/10/22 0402 07/11/22 0358 07/12/22 1514  WBC 25.5* 15.7* 18.6* 11.3*  NEUTROABS 22.8*  --   --   --   HGB 14.3 11.9* 12.4 15.3*  HCT 45.6 38.8 41.4 48.4*  MCV 91.0 93.9 93.9 89.6  PLT 354 271 305 0000000   Basic Metabolic Panel: Recent Labs  Lab 07/09/22 0427 07/10/22 0402 07/10/22 0526 07/11/22 0358 07/12/22 1514  NA 142 140 140 142 140  K 3.5 5.4* 5.1 3.9 4.5  CL 106 108 109 105 102  CO2 26 31 29 31 27   GLUCOSE 180* 144* 147* 150* 130*  BUN 8 12 11 14 13   CREATININE 0.80 0.74 0.67 0.65 0.79  CALCIUM 9.1 8.5* 8.1* 8.6* 9.9  MG 2.8* 2.7* 2.6* 2.6* 2.5*  PHOS  --  1.8* 1.7* <1.0* 2.4*   Liver Function Tests: Recent Labs  Lab 07/09/22 0427 07/11/22 0358 07/12/22 1514  AST 38  --   --   ALT 29  --   --   ALKPHOS 55  --   --   BILITOT 0.7  --   --   PROT 8.3*  --   --   ALBUMIN 4.1 3.4* 4.0   CBG: Recent Labs  Lab 07/12/22 0758 07/12/22 1234 07/12/22 1541 07/12/22 2202 07/13/22 1150  GLUCAP 112* 121* 133* 114* 154*    Discharge time spent: less than 30 minutes.  Signed: Annita Brod, MD [facility] 07/13/2022

## 2022-07-14 LAB — CULTURE, BLOOD (ROUTINE X 2): Culture: NO GROWTH

## 2022-08-08 ENCOUNTER — Telehealth: Payer: Self-pay | Admitting: Obstetrics and Gynecology

## 2022-08-08 NOTE — Telephone Encounter (Signed)
Pt is concerned with BC, pills seem not to be working. She is getting her period on the dates its not supposed to come. Please call her asap. Thanks :)

## 2022-09-09 IMAGING — CR DG CHEST 2V
2 series · 2 of 2 positions shown · non-contrast
Comparison: May 23, 2019

CLINICAL DATA: Cough and fever.

EXAM:
CHEST - 2 VIEW

[chest pa]
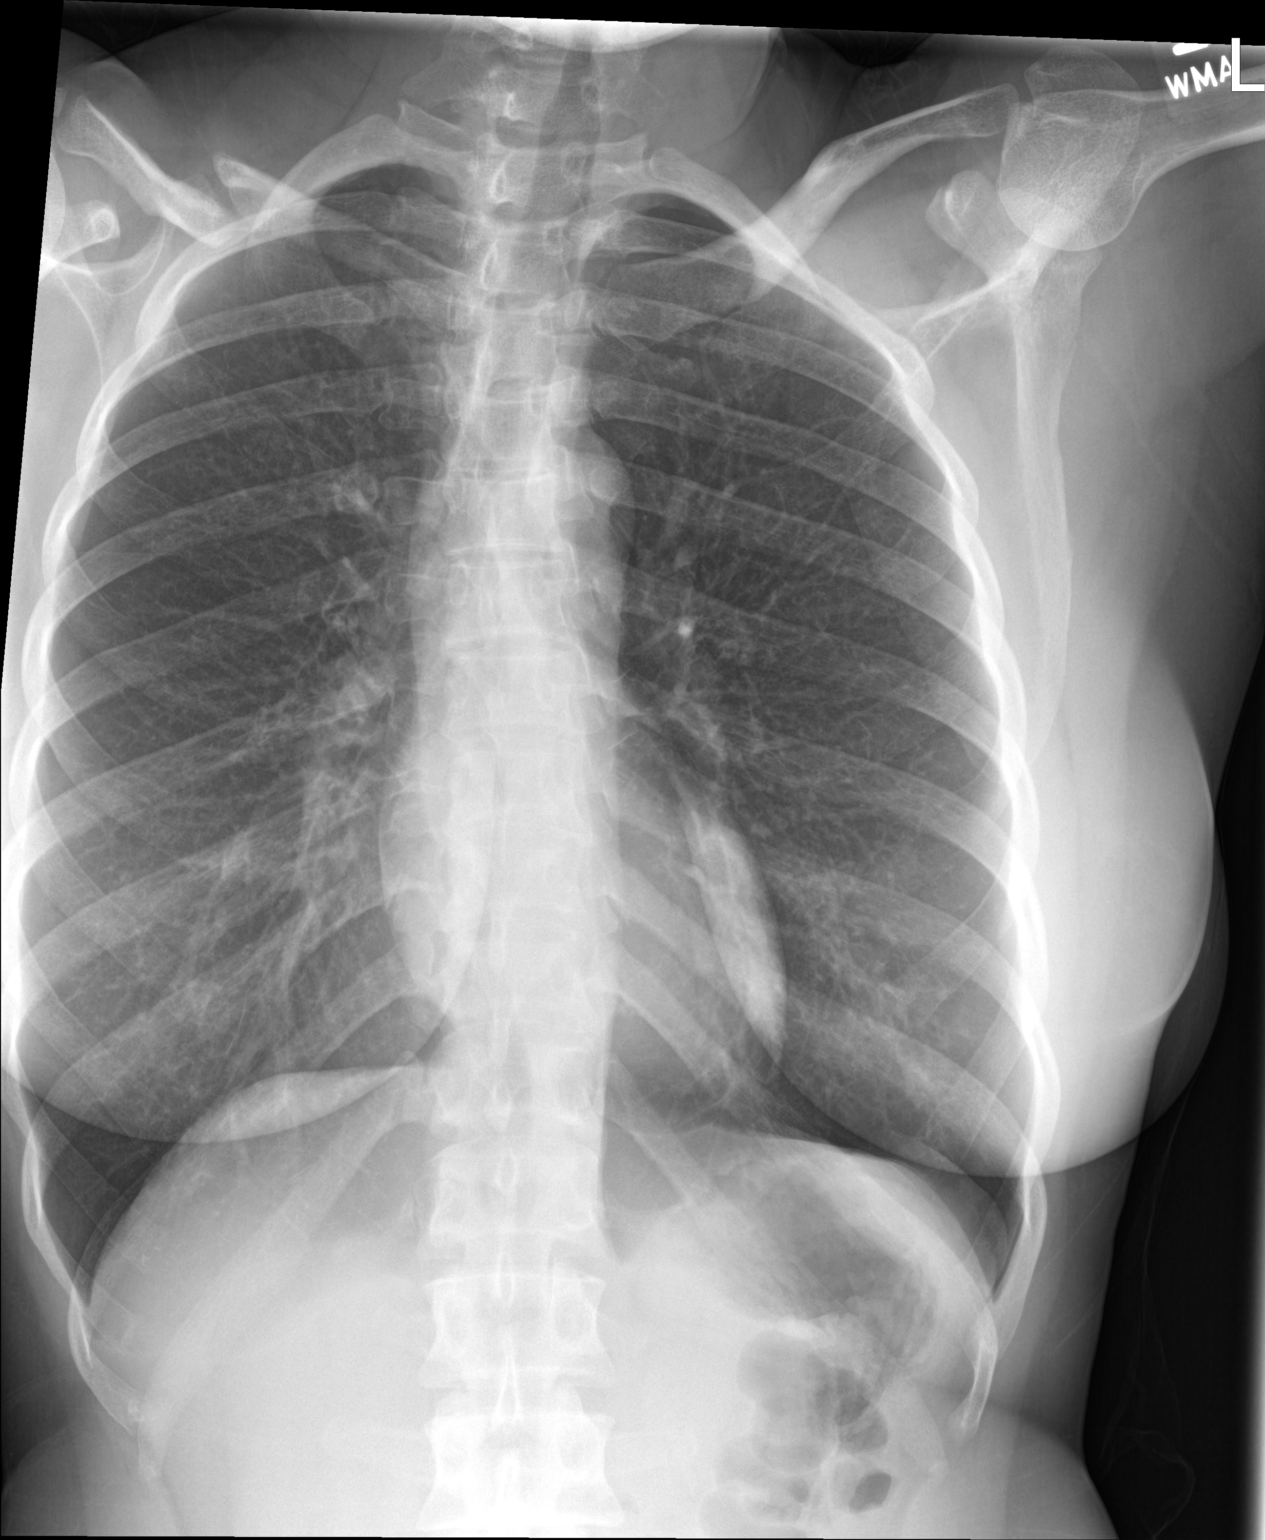

[chest lat]
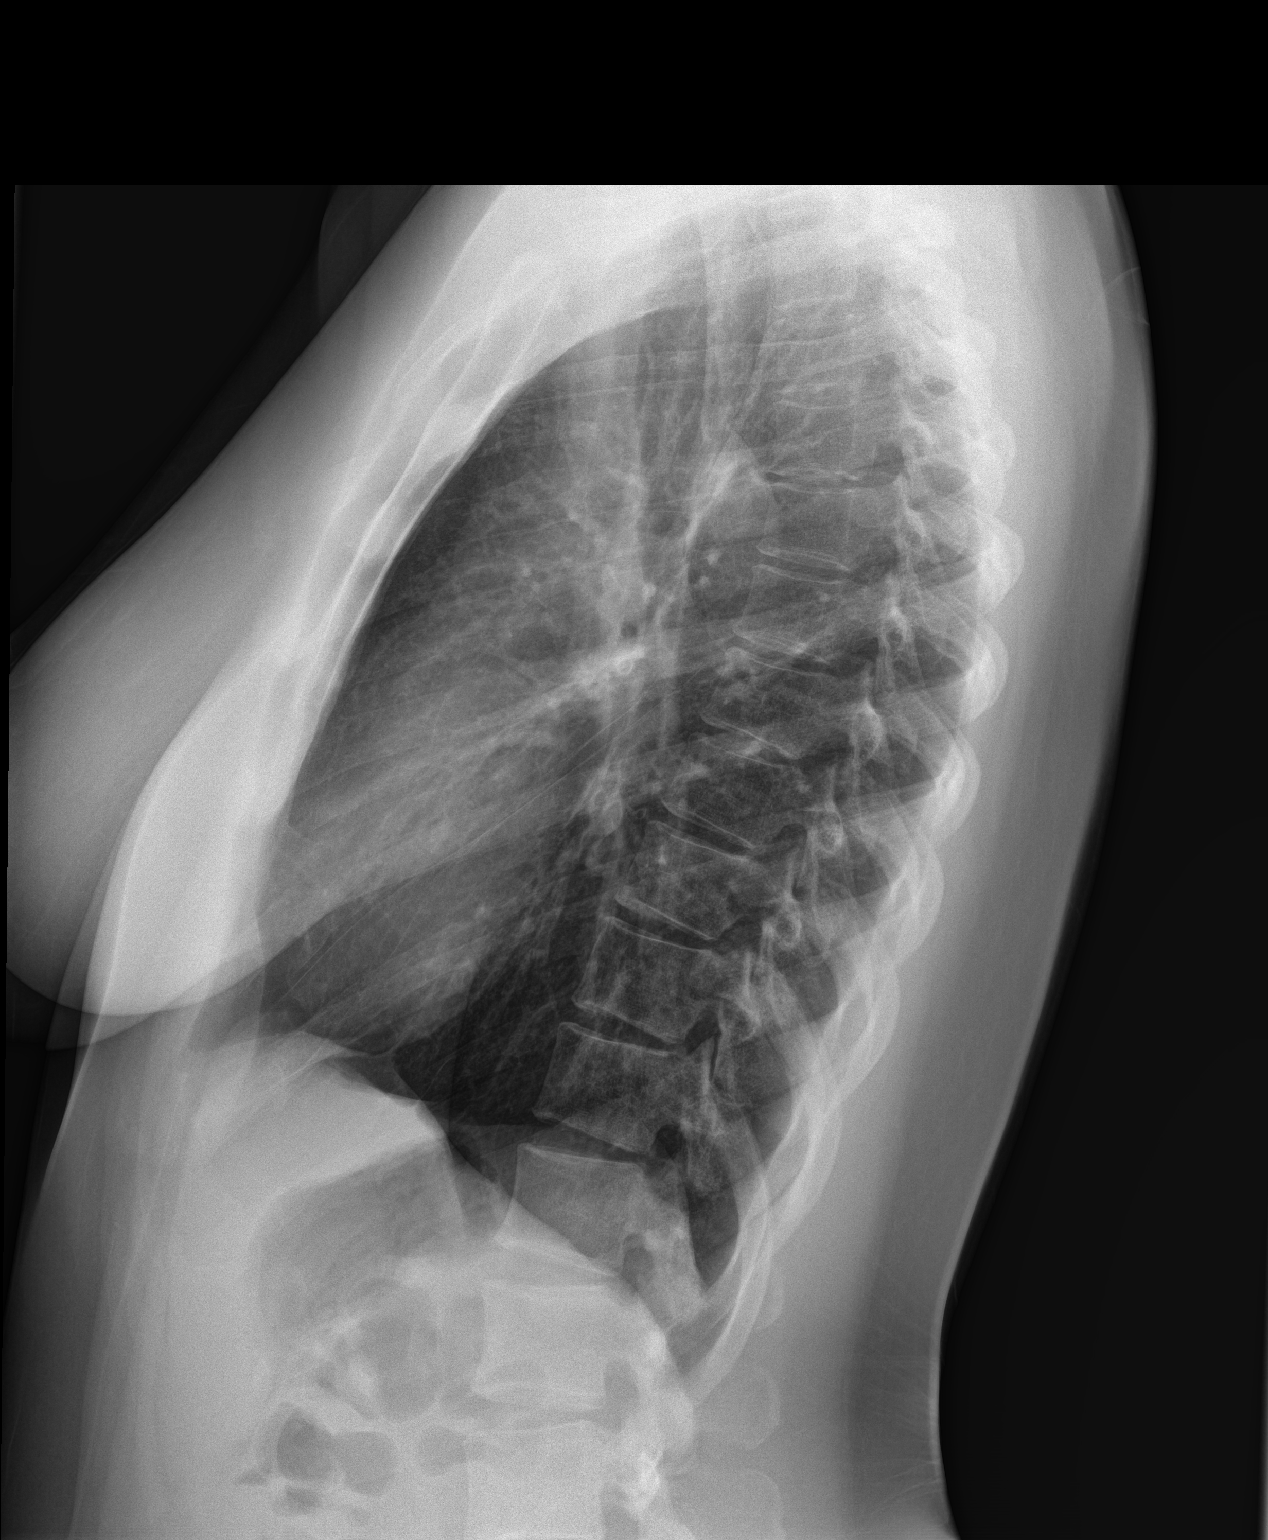

[2 of 2 positions shown; findings below may reference images not displayed]

FINDINGS: There is mild opacity at the right lung base. There is no pleural
effusion or pulmonary edema. The mediastinal contour and cardiac
silhouette are normal. Scoliosis of the spine is identified.
IMPRESSION: Mild opacity at the right lung base, developing pneumonia is not
excluded.

## 2023-01-09 ENCOUNTER — Telehealth: Payer: Self-pay | Admitting: Family Medicine

## 2023-01-09 NOTE — Telephone Encounter (Signed)
Pt called & needs 1 pack of OCP to last until her PE 02/10/23 to [facility] [place]. Please advise.

## 2023-02-05 ENCOUNTER — Other Ambulatory Visit: Payer: Self-pay | Admitting: Family Medicine

## 2023-02-05 DIAGNOSIS — Z30011 Encounter for initial prescription of contraceptive pills: Secondary | ICD-10-CM

## 2023-02-10 ENCOUNTER — Ambulatory Visit: Payer: Medicare Other

## 2024-07-25 ENCOUNTER — Ambulatory Visit: Admission: EM | Admit: 2024-07-25 | Discharge: 2024-07-25 | Disposition: A | Discharge status: Home / Self Care

## 2024-07-25 DIAGNOSIS — J4521 Mild intermittent asthma with (acute) exacerbation: Secondary | ICD-10-CM | POA: Diagnosis not present

## 2024-07-25 MED ORDER — PREDNISONE 20 MG PO TABS: 40.0000 mg | ORAL_TABLET | Freq: Every day | ORAL | 0 refills | Status: AC

## 2024-07-25 MED ORDER — IPRATROPIUM-ALBUTEROL 0.5-2.5 (3) MG/3ML IN SOLN
3.0000 mL | Freq: Once | RESPIRATORY_TRACT | Status: AC
  Administered 2024-07-25: 3 mL via RESPIRATORY_TRACT

## 2024-07-25 NOTE — ED Triage Notes (Signed)
 Annaleigha states I need a breathing treatment, I have been using my inhaler more times than prescribed. My nebulizer broke. Cough before thanksgiving and SOB x 2-3 days.

## 2024-07-25 NOTE — Discharge Instructions (Addendum)
 Use albuterol  every 4-6 hours as needed for shortness of breath and coughing fits.  Start prednisone  burst as we discussed.  Do not take NSAIDs with this medication including aspirin, ibuprofen/Advil, naproxen /Aleve .  If your symptoms are not improving within a few days of starting these medications please return for reevaluation.  If anything worsens and you have high fever, worsening cough, shortness of breath, chest pain, nausea/vomiting you need to be seen immediately.

## 2024-07-25 NOTE — ED Provider Notes (Signed)
 " [facility]    CSN: [id number] Arrival date & time: 07/25/24  1251      History   Chief Complaint No chief complaint on file.   HPI Monique Roy is a 38 y.o. female.   Patient presents today with a several day history of increasing shortness of breath and wheezing.  She does have a history of asthma and was previously on Flovent  but her insurance stopped covering this medication and so she was switched to something else but this caused back spasm and other side effects so she discontinued this and is not currently taking a maintenance medication.  She has been using her albuterol  more regularly with last dose approximately 3 hours ago.  She reports ongoing cough, wheeze, intermittent shortness of breath but denies any fever, chest pain, nausea, vomiting.  She denies any recent antibiotics or steroids.  She used to have a nebulizer at home this is more effective than the inhaler but the nebulizer machine broke and so she has been unable to use that since her symptoms began.  She has no concern for flu/COVID and is confident that her symptoms are related to an asthma exacerbation.    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 Authorizing Provider  predniSONE   (DELTASONE ) 20 MG tablet Take 2 tablets (40 mg total) by mouth daily for 5 days. 07/25/24 07/30/24 Yes Rollen Selders K, PA-C  albuterol  (VENTOLIN  HFA) 108 (90 Base) MCG/ACT inhaler Inhale 1-2 puffs into the lungs every 6 (six) hours as needed for wheezing or shortness of breath. 06/17/21   Rodriguez-Southworth, Sylvia, PA-C  carisoprodol  (SOMA ) 350 MG tablet Take 1 tablet (350 mg total) by mouth 3 (three) times daily. 07/01/22   Crain, Whitney L, PA  feeding supplement (ENSURE ENLIVE / ENSURE PLUS) LIQD Take 237 mLs by mouth 2 (two) times daily between meals. 07/13/22   Krishnan, Sendil K, MD  ipratropium-albuterol  (DUONEB) 0.5-2.5 (3) MG/3ML SOLN Inhale 3 mLs into the lungs every 4 (four) hours as needed.    [provider]  Norgestimate-Ethinyl Estradiol Triphasic 0.18/0.215/0.25 MG-25 MCG tab Take 1 tablet by mouth daily. 10/09/21   Dorlene Netter, FNP    Family History No family history on file.  Social History Social History[1]   Allergies   Patient has no known allergies.   Review of Systems Review of Systems  Constitutional:  Positive for activity change. Negative  for appetite change, fatigue and fever.  HENT:  Positive for congestion. Negative for sore throat.   Respiratory:  Positive for cough, shortness of breath and wheezing.   Cardiovascular:  Negative for chest pain.  Gastrointestinal:  Negative for nausea and vomiting.     Physical Exam Triage Vital Signs ED Triage Vitals  Encounter Vitals Group     BP 07/25/24 1343 106/67     Girls Systolic BP Percentile --      Girls Diastolic BP Percentile --      Boys Systolic BP Percentile --      Boys Diastolic BP Percentile --      Pulse --      Resp --      Temp 07/25/24 1343 97.9 F (36.6 C)     Temp Source 07/25/24 1343 Oral     SpO2 --      Weight 07/25/24 1339 145 lb (65.8 kg)     Height 07/25/24 1339 5' (1.524 m)     Head Circumference --      Peak Flow --      Pain Score 07/25/24 1339 0     Pain Loc --       Pain Education --      Exclude from Growth Chart --    No data found.  Updated Vital Signs BP 106/67 (BP Location: Left Arm)   Pulse 73   Temp 97.9 F (36.6 C) (Oral)   Resp 18   Ht 5' (1.524 m)   Wt 145 lb (65.8 kg)   LMP 07/12/2024   SpO2 93%   BMI 28.32 kg/m   Visual Acuity Right Eye Distance:   Left Eye Distance:   Bilateral Distance:    Right Eye Near:   Left Eye Near:    Bilateral Near:     Physical Exam Vitals reviewed.  Constitutional:      General: She is awake. She is not in acute distress.    Appearance: Normal appearance. She is well-developed. She is not ill-appearing.     Comments: Very pleasant female presented age in no acute distress sitting comfortably in exam room  HENT:     Head: Normocephalic and atraumatic.     Right Ear: Tympanic membrane, ear canal and external ear normal. Tympanic membrane is not erythematous or bulging.     Left Ear: Tympanic membrane, ear canal and external ear normal. Tympanic membrane is not erythematous or bulging.     Mouth/Throat:     Pharynx: Uvula midline. No oropharyngeal exudate or posterior oropharyngeal erythema.  Cardiovascular:     Rate and Rhythm: Normal rate and regular rhythm.     Heart sounds: Normal heart sounds, S1 normal and S2 normal. No murmur heard. Pulmonary:     Effort: Pulmonary effort is normal.     Breath sounds: Wheezing present. No rhonchi or rales.     Comments: Widespread wheezing that resolved following DuoNeb in clinic Musculoskeletal:     Comments: Congenital abnormality noted bilateral upper arms  Psychiatric:        Behavior: Behavior is cooperative.      UC Treatments / Results  Labs (all labs ordered are listed, but only abnormal results are displayed) Labs Reviewed - No data to display  EKG   Radiology No results found.  Procedures Procedures (including critical care time)  Medications Ordered in UC Medications  ipratropium-albuterol  (DUONEB) 0.5-2.5 (3)  MG/3ML nebulizer solution 3 mL (3 mLs Nebulization Given 07/25/24 1418)  Initial Impression / Assessment and Plan / UC Course  I have reviewed the triage vital signs and the nursing notes.  Pertinent labs & imaging results that were available during my care of the patient were reviewed by me and considered in my medical decision making (see chart for details).     Patient is well-appearing, afebrile, nontoxic, nontachycardic.  I did offer her COVID and flu testing but she reports that her symptoms are more consistent with a asthma exacerbation and so this was deferred.  Chest x-ray was deferred as her adventitious lung sounds resolved following DuoNeb in clinic.  We were able to provide her with a nebulizer machine to take home and she has plenty of the medication to be used as needed.  We discussed that she can use this every 4-6 hours as needed but given frequent need of these medications I also think it is reasonable to start prednisone  burst of 40 mg for 5 days.  We discussed that she is not to take NSAIDs with this medication and risk of GI bleeding.  She is to follow-up with her primary care.  We discussed that if anything worsens or changes she should return for reevaluation.  Return precautions given.  All questions answered to patient satisfaction.  Final Clinical Impressions(s) / UC Diagnoses   Final diagnoses:  Mild intermittent asthma with acute exacerbation     Discharge Instructions      Use albuterol  every 4-6 hours as needed for shortness of breath and coughing fits.  Start prednisone  burst as we discussed.  Do not take NSAIDs with this medication including aspirin, ibuprofen/Advil, naproxen /Aleve .  If your symptoms are not improving within a few days of starting these medications please return for reevaluation.  If anything worsens and you have high fever, worsening cough, shortness of breath, chest pain, nausea/vomiting you need to be seen immediately.     ED  Prescriptions     Medication Sig Dispense Auth. Provider   predniSONE  (DELTASONE ) 20 MG tablet Take 2 tablets (40 mg total) by mouth daily for 5 days. 10 tablet Mariame Rybolt K, PA-C      PDMP not reviewed this encounter.     [1]  Social History Tobacco Use   Smoking status: Never   Smokeless tobacco: Never  Vaping Use   Vaping status: Never Used  Substance Use Topics   Alcohol use: Not Currently    Comment: occasionally   Drug use: Not Currently    Types: Marijuana    Comment: daily     Sherrell Rocky POUR, PA-C 07/25/24 1449  "
